# Patient Record
Sex: Female | Born: 1945 | Race: White | Hispanic: No | Marital: Married | Smoking: Never smoker
Health system: Southern US, Community
[De-identification: ages and names within clinical notes are randomized; demographics above are authoritative.]

## PROBLEM LIST (undated history)

## (undated) DIAGNOSIS — M810 Age-related osteoporosis without current pathological fracture: Secondary | ICD-10-CM

## (undated) DIAGNOSIS — F32A Depression, unspecified: Secondary | ICD-10-CM

## (undated) DIAGNOSIS — G0490 Encephalitis and encephalomyelitis, unspecified: Secondary | ICD-10-CM

## (undated) DIAGNOSIS — R569 Unspecified convulsions: Secondary | ICD-10-CM

## (undated) DIAGNOSIS — F329 Major depressive disorder, single episode, unspecified: Secondary | ICD-10-CM

## (undated) HISTORY — PX: SPLENECTOMY, TOTAL: SHX788

## (undated) HISTORY — DX: Encephalitis and encephalomyelitis, unspecified: G04.90

## (undated) HISTORY — PX: ABDOMINAL HYSTERECTOMY: SHX81

## (undated) HISTORY — PX: OTHER SURGICAL HISTORY: SHX169

## (undated) HISTORY — DX: Unspecified convulsions: R56.9

---

## 2016-10-09 DIAGNOSIS — M25562 Pain in left knee: Secondary | ICD-10-CM | POA: Diagnosis not present

## 2016-10-09 DIAGNOSIS — M25561 Pain in right knee: Secondary | ICD-10-CM | POA: Diagnosis not present

## 2016-10-09 DIAGNOSIS — M25571 Pain in right ankle and joints of right foot: Secondary | ICD-10-CM | POA: Diagnosis not present

## 2016-10-09 DIAGNOSIS — R262 Difficulty in walking, not elsewhere classified: Secondary | ICD-10-CM | POA: Diagnosis not present

## 2016-10-12 DIAGNOSIS — M25571 Pain in right ankle and joints of right foot: Secondary | ICD-10-CM | POA: Diagnosis not present

## 2016-10-12 DIAGNOSIS — M25561 Pain in right knee: Secondary | ICD-10-CM | POA: Diagnosis not present

## 2016-10-12 DIAGNOSIS — M25562 Pain in left knee: Secondary | ICD-10-CM | POA: Diagnosis not present

## 2016-10-12 DIAGNOSIS — R262 Difficulty in walking, not elsewhere classified: Secondary | ICD-10-CM | POA: Diagnosis not present

## 2016-10-16 DIAGNOSIS — R262 Difficulty in walking, not elsewhere classified: Secondary | ICD-10-CM | POA: Diagnosis not present

## 2016-10-16 DIAGNOSIS — M25561 Pain in right knee: Secondary | ICD-10-CM | POA: Diagnosis not present

## 2016-10-16 DIAGNOSIS — M25571 Pain in right ankle and joints of right foot: Secondary | ICD-10-CM | POA: Diagnosis not present

## 2016-10-16 DIAGNOSIS — Z1231 Encounter for screening mammogram for malignant neoplasm of breast: Secondary | ICD-10-CM | POA: Diagnosis not present

## 2016-10-16 DIAGNOSIS — M25562 Pain in left knee: Secondary | ICD-10-CM | POA: Diagnosis not present

## 2016-10-18 DIAGNOSIS — M25571 Pain in right ankle and joints of right foot: Secondary | ICD-10-CM | POA: Diagnosis not present

## 2016-10-18 DIAGNOSIS — M25561 Pain in right knee: Secondary | ICD-10-CM | POA: Diagnosis not present

## 2016-10-18 DIAGNOSIS — M25562 Pain in left knee: Secondary | ICD-10-CM | POA: Diagnosis not present

## 2016-10-18 DIAGNOSIS — R262 Difficulty in walking, not elsewhere classified: Secondary | ICD-10-CM | POA: Diagnosis not present

## 2016-10-22 DIAGNOSIS — M25562 Pain in left knee: Secondary | ICD-10-CM | POA: Diagnosis not present

## 2016-10-22 DIAGNOSIS — R262 Difficulty in walking, not elsewhere classified: Secondary | ICD-10-CM | POA: Diagnosis not present

## 2016-10-22 DIAGNOSIS — M25571 Pain in right ankle and joints of right foot: Secondary | ICD-10-CM | POA: Diagnosis not present

## 2016-10-22 DIAGNOSIS — M25561 Pain in right knee: Secondary | ICD-10-CM | POA: Diagnosis not present

## 2016-10-25 DIAGNOSIS — M25562 Pain in left knee: Secondary | ICD-10-CM | POA: Diagnosis not present

## 2016-10-25 DIAGNOSIS — R262 Difficulty in walking, not elsewhere classified: Secondary | ICD-10-CM | POA: Diagnosis not present

## 2016-10-25 DIAGNOSIS — M25571 Pain in right ankle and joints of right foot: Secondary | ICD-10-CM | POA: Diagnosis not present

## 2016-10-25 DIAGNOSIS — M25561 Pain in right knee: Secondary | ICD-10-CM | POA: Diagnosis not present

## 2016-10-29 DIAGNOSIS — M25561 Pain in right knee: Secondary | ICD-10-CM | POA: Diagnosis not present

## 2016-10-29 DIAGNOSIS — R262 Difficulty in walking, not elsewhere classified: Secondary | ICD-10-CM | POA: Diagnosis not present

## 2016-10-29 DIAGNOSIS — M25571 Pain in right ankle and joints of right foot: Secondary | ICD-10-CM | POA: Diagnosis not present

## 2016-10-29 DIAGNOSIS — M25562 Pain in left knee: Secondary | ICD-10-CM | POA: Diagnosis not present

## 2016-11-02 DIAGNOSIS — M25571 Pain in right ankle and joints of right foot: Secondary | ICD-10-CM | POA: Diagnosis not present

## 2016-11-02 DIAGNOSIS — M25562 Pain in left knee: Secondary | ICD-10-CM | POA: Diagnosis not present

## 2016-11-02 DIAGNOSIS — R262 Difficulty in walking, not elsewhere classified: Secondary | ICD-10-CM | POA: Diagnosis not present

## 2016-11-02 DIAGNOSIS — M25561 Pain in right knee: Secondary | ICD-10-CM | POA: Diagnosis not present

## 2016-11-04 DIAGNOSIS — R079 Chest pain, unspecified: Secondary | ICD-10-CM | POA: Diagnosis not present

## 2016-11-04 DIAGNOSIS — R630 Anorexia: Secondary | ICD-10-CM | POA: Diagnosis not present

## 2016-11-04 DIAGNOSIS — R52 Pain, unspecified: Secondary | ICD-10-CM | POA: Diagnosis not present

## 2016-11-04 DIAGNOSIS — N390 Urinary tract infection, site not specified: Secondary | ICD-10-CM | POA: Diagnosis not present

## 2016-11-04 DIAGNOSIS — H838X9 Other specified diseases of inner ear, unspecified ear: Secondary | ICD-10-CM | POA: Diagnosis not present

## 2016-11-04 DIAGNOSIS — R0689 Other abnormalities of breathing: Secondary | ICD-10-CM | POA: Diagnosis not present

## 2016-11-04 DIAGNOSIS — R0981 Nasal congestion: Secondary | ICD-10-CM | POA: Diagnosis not present

## 2016-11-04 DIAGNOSIS — R918 Other nonspecific abnormal finding of lung field: Secondary | ICD-10-CM | POA: Diagnosis not present

## 2016-11-06 DIAGNOSIS — M25562 Pain in left knee: Secondary | ICD-10-CM | POA: Diagnosis not present

## 2016-11-06 DIAGNOSIS — M25571 Pain in right ankle and joints of right foot: Secondary | ICD-10-CM | POA: Diagnosis not present

## 2016-11-06 DIAGNOSIS — R262 Difficulty in walking, not elsewhere classified: Secondary | ICD-10-CM | POA: Diagnosis not present

## 2016-11-06 DIAGNOSIS — M25561 Pain in right knee: Secondary | ICD-10-CM | POA: Diagnosis not present

## 2016-11-09 DIAGNOSIS — R262 Difficulty in walking, not elsewhere classified: Secondary | ICD-10-CM | POA: Diagnosis not present

## 2016-11-09 DIAGNOSIS — M25562 Pain in left knee: Secondary | ICD-10-CM | POA: Diagnosis not present

## 2016-11-09 DIAGNOSIS — M25561 Pain in right knee: Secondary | ICD-10-CM | POA: Diagnosis not present

## 2016-11-09 DIAGNOSIS — M25571 Pain in right ankle and joints of right foot: Secondary | ICD-10-CM | POA: Diagnosis not present

## 2016-11-13 DIAGNOSIS — M25571 Pain in right ankle and joints of right foot: Secondary | ICD-10-CM | POA: Diagnosis not present

## 2016-11-13 DIAGNOSIS — R262 Difficulty in walking, not elsewhere classified: Secondary | ICD-10-CM | POA: Diagnosis not present

## 2016-11-13 DIAGNOSIS — M25562 Pain in left knee: Secondary | ICD-10-CM | POA: Diagnosis not present

## 2016-11-13 DIAGNOSIS — R911 Solitary pulmonary nodule: Secondary | ICD-10-CM | POA: Diagnosis not present

## 2016-11-13 DIAGNOSIS — R918 Other nonspecific abnormal finding of lung field: Secondary | ICD-10-CM | POA: Diagnosis not present

## 2016-11-13 DIAGNOSIS — M25561 Pain in right knee: Secondary | ICD-10-CM | POA: Diagnosis not present

## 2016-11-14 DIAGNOSIS — Z8673 Personal history of transient ischemic attack (TIA), and cerebral infarction without residual deficits: Secondary | ICD-10-CM | POA: Diagnosis not present

## 2016-11-14 DIAGNOSIS — R918 Other nonspecific abnormal finding of lung field: Secondary | ICD-10-CM | POA: Diagnosis not present

## 2016-11-14 DIAGNOSIS — R591 Generalized enlarged lymph nodes: Secondary | ICD-10-CM | POA: Diagnosis not present

## 2016-11-14 DIAGNOSIS — R7989 Other specified abnormal findings of blood chemistry: Secondary | ICD-10-CM | POA: Diagnosis not present

## 2016-11-15 DIAGNOSIS — M25571 Pain in right ankle and joints of right foot: Secondary | ICD-10-CM | POA: Diagnosis not present

## 2016-11-15 DIAGNOSIS — M25561 Pain in right knee: Secondary | ICD-10-CM | POA: Diagnosis not present

## 2016-11-15 DIAGNOSIS — R262 Difficulty in walking, not elsewhere classified: Secondary | ICD-10-CM | POA: Diagnosis not present

## 2016-11-15 DIAGNOSIS — M25562 Pain in left knee: Secondary | ICD-10-CM | POA: Diagnosis not present

## 2016-11-16 DIAGNOSIS — R911 Solitary pulmonary nodule: Secondary | ICD-10-CM | POA: Diagnosis not present

## 2016-11-16 DIAGNOSIS — R918 Other nonspecific abnormal finding of lung field: Secondary | ICD-10-CM | POA: Diagnosis not present

## 2016-11-16 DIAGNOSIS — Z9889 Other specified postprocedural states: Secondary | ICD-10-CM | POA: Diagnosis not present

## 2016-11-20 DIAGNOSIS — R791 Abnormal coagulation profile: Secondary | ICD-10-CM | POA: Diagnosis not present

## 2016-11-20 DIAGNOSIS — Z79899 Other long term (current) drug therapy: Secondary | ICD-10-CM | POA: Diagnosis not present

## 2016-11-20 DIAGNOSIS — R918 Other nonspecific abnormal finding of lung field: Secondary | ICD-10-CM | POA: Diagnosis not present

## 2016-11-22 DIAGNOSIS — M25561 Pain in right knee: Secondary | ICD-10-CM | POA: Diagnosis not present

## 2016-11-22 DIAGNOSIS — M25571 Pain in right ankle and joints of right foot: Secondary | ICD-10-CM | POA: Diagnosis not present

## 2016-11-22 DIAGNOSIS — M25562 Pain in left knee: Secondary | ICD-10-CM | POA: Diagnosis not present

## 2016-11-22 DIAGNOSIS — R262 Difficulty in walking, not elsewhere classified: Secondary | ICD-10-CM | POA: Diagnosis not present

## 2016-11-26 DIAGNOSIS — J90 Pleural effusion, not elsewhere classified: Secondary | ICD-10-CM | POA: Diagnosis not present

## 2016-11-26 DIAGNOSIS — R911 Solitary pulmonary nodule: Secondary | ICD-10-CM | POA: Diagnosis not present

## 2016-11-26 DIAGNOSIS — J9811 Atelectasis: Secondary | ICD-10-CM | POA: Diagnosis not present

## 2016-11-26 DIAGNOSIS — D3A090 Benign carcinoid tumor of the bronchus and lung: Secondary | ICD-10-CM | POA: Diagnosis not present

## 2016-11-26 DIAGNOSIS — M19019 Primary osteoarthritis, unspecified shoulder: Secondary | ICD-10-CM | POA: Diagnosis not present

## 2016-11-26 DIAGNOSIS — J948 Other specified pleural conditions: Secondary | ICD-10-CM | POA: Diagnosis not present

## 2016-11-26 DIAGNOSIS — J939 Pneumothorax, unspecified: Secondary | ICD-10-CM | POA: Diagnosis not present

## 2016-11-26 DIAGNOSIS — R0989 Other specified symptoms and signs involving the circulatory and respiratory systems: Secondary | ICD-10-CM | POA: Diagnosis not present

## 2016-11-26 DIAGNOSIS — M4714 Other spondylosis with myelopathy, thoracic region: Secondary | ICD-10-CM | POA: Diagnosis not present

## 2016-11-26 DIAGNOSIS — C7A090 Malignant carcinoid tumor of the bronchus and lung: Secondary | ICD-10-CM | POA: Diagnosis present

## 2016-11-26 DIAGNOSIS — G8918 Other acute postprocedural pain: Secondary | ICD-10-CM | POA: Diagnosis not present

## 2016-11-26 DIAGNOSIS — Z87891 Personal history of nicotine dependence: Secondary | ICD-10-CM | POA: Diagnosis not present

## 2016-11-26 DIAGNOSIS — G40909 Epilepsy, unspecified, not intractable, without status epilepticus: Secondary | ICD-10-CM | POA: Diagnosis present

## 2016-11-26 DIAGNOSIS — Z9889 Other specified postprocedural states: Secondary | ICD-10-CM | POA: Diagnosis not present

## 2016-12-02 DIAGNOSIS — J90 Pleural effusion, not elsewhere classified: Secondary | ICD-10-CM | POA: Diagnosis not present

## 2016-12-02 DIAGNOSIS — D72829 Elevated white blood cell count, unspecified: Secondary | ICD-10-CM | POA: Diagnosis not present

## 2016-12-02 DIAGNOSIS — J9811 Atelectasis: Secondary | ICD-10-CM | POA: Diagnosis not present

## 2016-12-02 DIAGNOSIS — R0789 Other chest pain: Secondary | ICD-10-CM | POA: Diagnosis not present

## 2016-12-02 DIAGNOSIS — Z87891 Personal history of nicotine dependence: Secondary | ICD-10-CM | POA: Diagnosis not present

## 2016-12-02 DIAGNOSIS — Z9889 Other specified postprocedural states: Secondary | ICD-10-CM | POA: Diagnosis not present

## 2016-12-02 DIAGNOSIS — G8918 Other acute postprocedural pain: Secondary | ICD-10-CM | POA: Diagnosis not present

## 2016-12-02 DIAGNOSIS — R079 Chest pain, unspecified: Secondary | ICD-10-CM | POA: Diagnosis not present

## 2016-12-11 DIAGNOSIS — M25562 Pain in left knee: Secondary | ICD-10-CM | POA: Diagnosis not present

## 2016-12-11 DIAGNOSIS — M25571 Pain in right ankle and joints of right foot: Secondary | ICD-10-CM | POA: Diagnosis not present

## 2016-12-11 DIAGNOSIS — M25561 Pain in right knee: Secondary | ICD-10-CM | POA: Diagnosis not present

## 2016-12-11 DIAGNOSIS — R262 Difficulty in walking, not elsewhere classified: Secondary | ICD-10-CM | POA: Diagnosis not present

## 2016-12-17 DIAGNOSIS — Z9889 Other specified postprocedural states: Secondary | ICD-10-CM | POA: Diagnosis not present

## 2016-12-17 DIAGNOSIS — J90 Pleural effusion, not elsewhere classified: Secondary | ICD-10-CM | POA: Diagnosis not present

## 2016-12-18 DIAGNOSIS — C3412 Malignant neoplasm of upper lobe, left bronchus or lung: Secondary | ICD-10-CM | POA: Diagnosis not present

## 2017-01-15 DIAGNOSIS — G40909 Epilepsy, unspecified, not intractable, without status epilepticus: Secondary | ICD-10-CM | POA: Diagnosis not present

## 2017-03-11 DIAGNOSIS — Z6837 Body mass index (BMI) 37.0-37.9, adult: Secondary | ICD-10-CM | POA: Diagnosis not present

## 2017-03-11 DIAGNOSIS — R0609 Other forms of dyspnea: Secondary | ICD-10-CM | POA: Diagnosis not present

## 2017-03-11 DIAGNOSIS — I358 Other nonrheumatic aortic valve disorders: Secondary | ICD-10-CM | POA: Diagnosis not present

## 2017-03-11 DIAGNOSIS — R600 Localized edema: Secondary | ICD-10-CM | POA: Diagnosis not present

## 2017-03-11 DIAGNOSIS — M25561 Pain in right knee: Secondary | ICD-10-CM | POA: Diagnosis not present

## 2017-03-18 DIAGNOSIS — R609 Edema, unspecified: Secondary | ICD-10-CM | POA: Diagnosis not present

## 2017-03-18 DIAGNOSIS — I358 Other nonrheumatic aortic valve disorders: Secondary | ICD-10-CM | POA: Diagnosis not present

## 2017-03-18 DIAGNOSIS — R600 Localized edema: Secondary | ICD-10-CM | POA: Diagnosis not present

## 2017-03-18 DIAGNOSIS — I517 Cardiomegaly: Secondary | ICD-10-CM | POA: Diagnosis not present

## 2017-03-18 DIAGNOSIS — I071 Rheumatic tricuspid insufficiency: Secondary | ICD-10-CM | POA: Diagnosis not present

## 2017-04-01 DIAGNOSIS — M1711 Unilateral primary osteoarthritis, right knee: Secondary | ICD-10-CM | POA: Diagnosis not present

## 2017-04-01 DIAGNOSIS — Z8781 Personal history of (healed) traumatic fracture: Secondary | ICD-10-CM | POA: Diagnosis not present

## 2017-04-01 DIAGNOSIS — M19071 Primary osteoarthritis, right ankle and foot: Secondary | ICD-10-CM | POA: Diagnosis not present

## 2017-04-17 DIAGNOSIS — M25571 Pain in right ankle and joints of right foot: Secondary | ICD-10-CM | POA: Diagnosis not present

## 2017-04-17 DIAGNOSIS — M19071 Primary osteoarthritis, right ankle and foot: Secondary | ICD-10-CM | POA: Diagnosis not present

## 2017-07-09 DIAGNOSIS — Z23 Encounter for immunization: Secondary | ICD-10-CM | POA: Diagnosis not present

## 2017-07-16 DIAGNOSIS — G40909 Epilepsy, unspecified, not intractable, without status epilepticus: Secondary | ICD-10-CM | POA: Diagnosis not present

## 2017-07-16 DIAGNOSIS — G0490 Encephalitis and encephalomyelitis, unspecified: Secondary | ICD-10-CM | POA: Diagnosis not present

## 2017-12-19 DIAGNOSIS — F329 Major depressive disorder, single episode, unspecified: Secondary | ICD-10-CM | POA: Diagnosis not present

## 2017-12-19 DIAGNOSIS — Z8249 Family history of ischemic heart disease and other diseases of the circulatory system: Secondary | ICD-10-CM | POA: Diagnosis not present

## 2017-12-19 DIAGNOSIS — M859 Disorder of bone density and structure, unspecified: Secondary | ICD-10-CM | POA: Diagnosis not present

## 2017-12-20 DIAGNOSIS — Z6837 Body mass index (BMI) 37.0-37.9, adult: Secondary | ICD-10-CM | POA: Diagnosis not present

## 2017-12-20 DIAGNOSIS — Z780 Asymptomatic menopausal state: Secondary | ICD-10-CM | POA: Diagnosis not present

## 2017-12-20 DIAGNOSIS — G40209 Localization-related (focal) (partial) symptomatic epilepsy and epileptic syndromes with complex partial seizures, not intractable, without status epilepticus: Secondary | ICD-10-CM | POA: Diagnosis not present

## 2017-12-20 DIAGNOSIS — Z0000 Encounter for general adult medical examination without abnormal findings: Secondary | ICD-10-CM | POA: Diagnosis not present

## 2017-12-20 DIAGNOSIS — I358 Other nonrheumatic aortic valve disorders: Secondary | ICD-10-CM | POA: Diagnosis not present

## 2017-12-23 DIAGNOSIS — H903 Sensorineural hearing loss, bilateral: Secondary | ICD-10-CM | POA: Diagnosis not present

## 2017-12-23 DIAGNOSIS — H6123 Impacted cerumen, bilateral: Secondary | ICD-10-CM | POA: Diagnosis not present

## 2017-12-23 DIAGNOSIS — H900 Conductive hearing loss, bilateral: Secondary | ICD-10-CM | POA: Diagnosis not present

## 2017-12-30 DIAGNOSIS — Z85118 Personal history of other malignant neoplasm of bronchus and lung: Secondary | ICD-10-CM | POA: Diagnosis not present

## 2017-12-31 DIAGNOSIS — Z780 Asymptomatic menopausal state: Secondary | ICD-10-CM | POA: Diagnosis not present

## 2017-12-31 DIAGNOSIS — M85852 Other specified disorders of bone density and structure, left thigh: Secondary | ICD-10-CM | POA: Diagnosis not present

## 2017-12-31 DIAGNOSIS — M810 Age-related osteoporosis without current pathological fracture: Secondary | ICD-10-CM | POA: Diagnosis not present

## 2017-12-31 DIAGNOSIS — I358 Other nonrheumatic aortic valve disorders: Secondary | ICD-10-CM | POA: Diagnosis not present

## 2017-12-31 DIAGNOSIS — Z1231 Encounter for screening mammogram for malignant neoplasm of breast: Secondary | ICD-10-CM | POA: Diagnosis not present

## 2018-01-14 DIAGNOSIS — D3A00 Benign carcinoid tumor of unspecified site: Secondary | ICD-10-CM | POA: Diagnosis not present

## 2018-02-05 DIAGNOSIS — M810 Age-related osteoporosis without current pathological fracture: Secondary | ICD-10-CM | POA: Diagnosis not present

## 2018-02-05 DIAGNOSIS — Z6836 Body mass index (BMI) 36.0-36.9, adult: Secondary | ICD-10-CM | POA: Diagnosis not present

## 2018-02-11 DIAGNOSIS — G40909 Epilepsy, unspecified, not intractable, without status epilepticus: Secondary | ICD-10-CM | POA: Diagnosis not present

## 2018-03-28 ENCOUNTER — Encounter: Payer: Self-pay | Admitting: Neurology

## 2018-05-23 DIAGNOSIS — M2041 Other hammer toe(s) (acquired), right foot: Secondary | ICD-10-CM | POA: Diagnosis not present

## 2018-05-23 DIAGNOSIS — M2042 Other hammer toe(s) (acquired), left foot: Secondary | ICD-10-CM | POA: Diagnosis not present

## 2018-05-23 DIAGNOSIS — L602 Onychogryphosis: Secondary | ICD-10-CM | POA: Diagnosis not present

## 2018-05-23 DIAGNOSIS — B351 Tinea unguium: Secondary | ICD-10-CM | POA: Diagnosis not present

## 2018-05-30 DIAGNOSIS — B351 Tinea unguium: Secondary | ICD-10-CM | POA: Diagnosis not present

## 2018-06-10 ENCOUNTER — Encounter: Payer: Self-pay | Admitting: Neurology

## 2018-06-10 ENCOUNTER — Ambulatory Visit (INDEPENDENT_AMBULATORY_CARE_PROVIDER_SITE_OTHER): Payer: Medicare Other | Admitting: Neurology

## 2018-06-10 ENCOUNTER — Other Ambulatory Visit: Payer: Self-pay

## 2018-06-10 VITALS — BP 116/68 | HR 80 | Ht 66.0 in | Wt 211.0 lb

## 2018-06-10 DIAGNOSIS — Z8661 Personal history of infections of the central nervous system: Secondary | ICD-10-CM | POA: Diagnosis not present

## 2018-06-10 DIAGNOSIS — G40219 Localization-related (focal) (partial) symptomatic epilepsy and epileptic syndromes with complex partial seizures, intractable, without status epilepticus: Secondary | ICD-10-CM

## 2018-06-10 MED ORDER — LACOSAMIDE 100 MG PO TABS: ORAL_TABLET | ORAL | 3 refills | Status: DC

## 2018-06-10 MED ORDER — LEVETIRACETAM ER 500 MG PO TB24: ORAL_TABLET | ORAL | 3 refills | Status: DC

## 2018-06-10 NOTE — Patient Instructions (Addendum)
Great meeting you! 1. Continue Keppra XR 500mg : take 4 tablets every night and Vimpat 100mg  twice a day 2. Follow-up in 6 months, call for any changes  Seizure Precautions: 1. If medication has been prescribed for you to prevent seizures, take it exactly as directed.  Do not stop taking the medicine without talking to your doctor first, even if you have not had a seizure in a long time.   2. Avoid activities in which a seizure would cause danger to yourself or to others.  Don't operate dangerous machinery, swim alone, or climb in high or dangerous places, such as on ladders, roofs, or girders.  Do not drive unless your doctor says you may.  3. If you have any warning that you may have a seizure, lay down in a safe place where you can't hurt yourself.    4.  No driving for 6 months from last seizure, as per [facility].   Please refer to the following link on the [facility] website for more information: [email or web address]   5.  Maintain good sleep hygiene. Avoid alcohol.  6.  Contact your doctor if you have any problems that may be related to the medicine you are taking.  7.  Call 911 and bring the patient back to the ED if:        A.  The seizure lasts longer than 5 minutes.       B.  The patient doesn't awaken shortly after the seizure  C.  The patient has new problems such as difficulty seeing, speaking or moving  D.  The patient was injured during the seizure  E.  The patient has a temperature over 102 F (39C)  F.  The patient vomited and now is having trouble breathing

## 2018-06-10 NOTE — Progress Notes (Signed)
NEUROLOGY CONSULTATION NOTE  Mallory Boone MRN: [id number] DOB: 06/27/1946  Referring provider: Dr. Jalene Mullet Primary care provider: none listed  Reason for consult:  Establish care for seizures  Dear Dr Ysidro Evert:  Thank you for your kind referral of Mallory Boone for consultation of the above symptoms. Although her history is well known to you, please allow me to reiterate it for the purpose of our medical record. The patient was accompanied to the clinic by her husband who also provides collateral information. Records and images were personally reviewed where available.  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 She is amnestic of her seizures with no prior warning symptoms. Her husband describes stereotyped episodes where she would start rocking and grimacing, perseverating repeatedly saying "I'm okay, it's okay" for 10 seconds. She would be disoriented after, saying "I'm hot, I'm cold, I need to use the bathroom, where is it?" Her husband feels they occur more when she is slowing down or when it is quiet (such as in church). He has not seen any nocturnal seizures. She has only had 2 convulsions when she was initially diagnosed with encephalitis in 1996, none since then. He recalls her trying Dilantin and Lamictal in the past. She has been on Keppra XR 2000mg  daily for at least 10 years, Vimpat 100mg  BID was added on 18 months ago. She was having 2-3 seizures a day, and had a significant reduction with addition of Vimpat. Over the past 6 months, her husband has noticed she would sometimes do a little pedaling of both feet. Her husband reports an average of 2 seizures a week, last seizure was 2 days ago. No associated tongue bite or incontinence. No side effects on medications. She denies any olfactory/gustatory hallucinations, deja vu, rising  epigastric sensation, focal numbness/tingling/weakness, myoclonic jerks. She denies any headaches, dizziness, diplopia, dysarthria/dysphagia, neck/back pain, bowel dysfunction. She has urinary frequency. Her hands feels stiff sometimes. She has had cognitive issues since the encephalitis, she does not drive. Her husband manages finances. She has to write things down in a journal daily. She manages her own medications. She is independent with dressing and bathing. They moved to [place] 3 months ago to be closer to family. She said they moved a year ago. Her mother had Alzheimer's disease, her father had Lewy Body dementia.  No prior EEGs or MRI available for review. 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 Per notes, she had an abnormal EEG in 2016, report unavailable for review.  Epilepsy Risk Factors:  Encephalitis in 1996. Otherwise she had a normal birth and early development.  There is no history of febrile convulsions, significant traumatic brain injury, neurosurgical procedures, or family history of seizures.  Prior AEDs: Dilantin, lamictal  PAST MEDICAL HISTORY: Past Medical History:  Diagnosis Date  . Encephalitis   . Seizures ([place])     PAST SURGICAL HISTORY: History reviewed. No pertinent surgical history.  MEDICATIONS:  Outpatient Encounter Medications as of 06/10/2018  Medication Sig  . calcium-vitamin D (OSCAL WITH D) 500-200 MG-UNIT tablet Take 1 tablet by mouth.  . Lacosamide 100 MG TABS Take 1 tablet twice a day  . levETIRAcetam (KEPPRA XR) 500 MG 24 hr tablet Take 4 tablets every night  . ranitidine (ZANTAC) 150 MG tablet Take 150 mg by mouth 2 (two) times daily.  . Vilazodone HCl (  VIIBRYD PO) Take by mouth.  .    .     No facility-administered encounter medications on file as of 06/10/2018.     ALLERGIES: No Known Allergies  FAMILY HISTORY: History reviewed. No pertinent family history.  SOCIAL HISTORY: Social History     Socioeconomic History  . Marital status: Married    Spouse name: Not on file  . Number of children: Not on file  . Years of education: Not on file  . Highest education level: Not on file  Occupational History  . Not on file  Social Needs  . Financial resource strain: Not on file  . Food insecurity:    Worry: Not on file    Inability: Not on file  . Transportation needs:    Medical: Not on file    Non-medical: Not on file  Tobacco Use  . Smoking status: Not on file  Substance and Sexual Activity  . Alcohol use: Not on file  . Drug use: Not on file  . Sexual activity: Not on file  Lifestyle  . Physical activity:    Days per week: Not on file    Minutes per session: Not on file  . Stress: Not on file  Relationships  . Social connections:    Talks on phone: Not on file    Gets together: Not on file    Attends religious service: Not on file    Active member of club or organization: Not on file    Attends meetings of clubs or organizations: Not on file    Relationship status: Not on file  . Intimate partner violence:    Fear of current or ex partner: Not on file    Emotionally abused: Not on file    Physically abused: Not on file    Forced sexual activity: Not on file  Other Topics Concern  . Not on file  Social History Narrative  . Not on file    REVIEW OF SYSTEMS: Constitutional: No fevers, chills, or sweats, no generalized fatigue, change in appetite Eyes: No visual changes, double vision, eye pain Ear, nose and throat: No hearing loss, ear pain, nasal congestion, sore throat Cardiovascular: No chest pain, palpitations Respiratory:  No shortness of breath at rest or with exertion, wheezes GastrointestinaI: No nausea, vomiting, diarrhea, abdominal pain, fecal incontinence Genitourinary:  No dysuria, urinary retention or frequency Musculoskeletal:  No neck pain, back pain Integumentary: No rash, pruritus, skin lesions Neurological: as above Psychiatric: No  depression, insomnia, anxiety Endocrine: No palpitations, fatigue, diaphoresis, mood swings, change in appetite, change in weight, increased thirst Hematologic/Lymphatic:  No anemia, purpura, petechiae. Allergic/Immunologic: no itchy/runny eyes, nasal congestion, recent allergic reactions, rashes  PHYSICAL EXAM: Vitals:   06/10/18 1430  BP: 116/68  Pulse: 80  SpO2: 95%   General: No acute distress Head:  Normocephalic/atraumatic Eyes: Fundoscopic exam shows bilateral sharp discs, no vessel changes, exudates, or hemorrhages Neck: supple, no paraspinal tenderness, full range of motion Back: No paraspinal tenderness Heart: regular rate and rhythm Lungs: Clear to auscultation bilaterally. Vascular: No carotid bruits. Skin/Extremities: No rash, no edema Neurological Exam: Mental status: alert and oriented to person, place, day of week. States it is June/spring. No dysarthria. She has mild expressive aphasia with word-finding difficulties, difficulty naming. Fund of knowledge is reduced.  Recent and remote memory are impaired.  Attention and concentration are normal.    Able to repeat phrases. Difficulty naming "button." When asked similarity between train and bike,she says "legs." MMSE - Mini  Mental State Exam 06/10/2018  Orientation to time 1  Orientation to Place 3  Registration 3  Attention/ Calculation 5  Recall 0  Language- name 2 objects 1  Language- repeat 1  Language- follow 3 step command 3  Language- read & follow direction 1  Write a sentence 1  Copy design 1  Total score 20   Cranial nerves: CN I: not tested CN II: pupils equal, round and reactive to light, visual fields intact, fundi unremarkable. CN III, IV, VI:  full range of motion, no nystagmus, no ptosis CN V: facial sensation intact CN VII: upper and lower face symmetric CN VIII: hearing intact to finger rub CN IX, X: gag intact, uvula midline CN XI: sternocleidomastoid and trapezius muscles intact CN XII:  tongue midline Bulk & Tone: normal, no fasciculations. Motor: 5/5 throughout with no pronator drift. Sensation: intact to light touch, cold, pin, vibration and joint position sense.  No extinction to double simultaneous stimulation.  Romberg test negative Deep Tendon Reflexes: +2 throughout, no ankle clonus Plantar responses: downgoing bilaterally Cerebellar: no incoordination on finger to nose, heel to shin. 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 No prior MRI or EEG available for review. She continues to have around 2 seizures a week, however she and her husband are satisfied with current seizure frequency since this is significantly less than prior. We discussed that there is room to increase Vimpat, they would like to hold off for now. Refills for Keppra XR 500mg  4 tabs qhs and Vimpat 100mg  BID were sent in today. She does not drive. Follow-up in 6 months, they know to call for any changes.   Thank you for allowing me to participate in the care of this patient. Please do not hesitate to call for any questions or concerns.   Ellouise Newer, M.D.  CC: Dr. Ysidro Evert

## 2018-06-23 DIAGNOSIS — Z23 Encounter for immunization: Secondary | ICD-10-CM | POA: Diagnosis not present

## 2018-06-24 DIAGNOSIS — H25013 Cortical age-related cataract, bilateral: Secondary | ICD-10-CM | POA: Diagnosis not present

## 2018-06-24 DIAGNOSIS — H02831 Dermatochalasis of right upper eyelid: Secondary | ICD-10-CM | POA: Diagnosis not present

## 2018-06-24 DIAGNOSIS — H25043 Posterior subcapsular polar age-related cataract, bilateral: Secondary | ICD-10-CM | POA: Diagnosis not present

## 2018-06-24 DIAGNOSIS — H2513 Age-related nuclear cataract, bilateral: Secondary | ICD-10-CM | POA: Diagnosis not present

## 2018-06-24 DIAGNOSIS — H2512 Age-related nuclear cataract, left eye: Secondary | ICD-10-CM | POA: Diagnosis not present

## 2018-06-24 DIAGNOSIS — H18413 Arcus senilis, bilateral: Secondary | ICD-10-CM | POA: Diagnosis not present

## 2018-06-30 ENCOUNTER — Other Ambulatory Visit: Payer: Self-pay

## 2018-06-30 ENCOUNTER — Encounter (HOSPITAL_COMMUNITY): Payer: Self-pay | Admitting: *Deleted

## 2018-06-30 ENCOUNTER — Emergency Department (HOSPITAL_COMMUNITY): Payer: Medicare Other

## 2018-06-30 ENCOUNTER — Inpatient Hospital Stay (HOSPITAL_COMMUNITY)
Admission: EM | Admit: 2018-06-30 | Discharge: 2018-07-03 | DRG: 101 | Disposition: A | Discharge status: Home Health | Payer: Medicare Other | Attending: Family Medicine | Admitting: Family Medicine

## 2018-06-30 DIAGNOSIS — R509 Fever, unspecified: Secondary | ICD-10-CM | POA: Diagnosis present

## 2018-06-30 DIAGNOSIS — Z7983 Long term (current) use of bisphosphonates: Secondary | ICD-10-CM | POA: Diagnosis not present

## 2018-06-30 DIAGNOSIS — G8384 Todd's paralysis (postepileptic): Secondary | ICD-10-CM | POA: Diagnosis present

## 2018-06-30 DIAGNOSIS — Z8661 Personal history of infections of the central nervous system: Secondary | ICD-10-CM | POA: Diagnosis not present

## 2018-06-30 DIAGNOSIS — Z902 Acquired absence of lung [part of]: Secondary | ICD-10-CM

## 2018-06-30 DIAGNOSIS — D72829 Elevated white blood cell count, unspecified: Secondary | ICD-10-CM | POA: Diagnosis present

## 2018-06-30 DIAGNOSIS — R569 Unspecified convulsions: Secondary | ICD-10-CM

## 2018-06-30 DIAGNOSIS — I6523 Occlusion and stenosis of bilateral carotid arteries: Secondary | ICD-10-CM | POA: Diagnosis not present

## 2018-06-30 DIAGNOSIS — F0390 Unspecified dementia without behavioral disturbance: Secondary | ICD-10-CM | POA: Diagnosis present

## 2018-06-30 DIAGNOSIS — E876 Hypokalemia: Secondary | ICD-10-CM | POA: Diagnosis present

## 2018-06-30 DIAGNOSIS — G9389 Other specified disorders of brain: Secondary | ICD-10-CM | POA: Diagnosis present

## 2018-06-30 DIAGNOSIS — Z90710 Acquired absence of both cervix and uterus: Secondary | ICD-10-CM | POA: Diagnosis not present

## 2018-06-30 DIAGNOSIS — Z9081 Acquired absence of spleen: Secondary | ICD-10-CM | POA: Diagnosis not present

## 2018-06-30 DIAGNOSIS — Z6835 Body mass index (BMI) 35.0-35.9, adult: Secondary | ICD-10-CM

## 2018-06-30 DIAGNOSIS — Z86012 Personal history of benign carcinoid tumor: Secondary | ICD-10-CM

## 2018-06-30 DIAGNOSIS — G40909 Epilepsy, unspecified, not intractable, without status epilepticus: Secondary | ICD-10-CM | POA: Diagnosis not present

## 2018-06-30 DIAGNOSIS — F39 Unspecified mood [affective] disorder: Secondary | ICD-10-CM | POA: Diagnosis present

## 2018-06-30 DIAGNOSIS — G0490 Encephalitis and encephalomyelitis, unspecified: Secondary | ICD-10-CM | POA: Diagnosis not present

## 2018-06-30 DIAGNOSIS — R0789 Other chest pain: Secondary | ICD-10-CM | POA: Diagnosis not present

## 2018-06-30 DIAGNOSIS — R29707 NIHSS score 7: Secondary | ICD-10-CM | POA: Diagnosis present

## 2018-06-30 DIAGNOSIS — T368X5A Adverse effect of other systemic antibiotics, initial encounter: Secondary | ICD-10-CM | POA: Diagnosis not present

## 2018-06-30 DIAGNOSIS — R4182 Altered mental status, unspecified: Secondary | ICD-10-CM | POA: Diagnosis not present

## 2018-06-30 DIAGNOSIS — F329 Major depressive disorder, single episode, unspecified: Secondary | ICD-10-CM | POA: Diagnosis present

## 2018-06-30 DIAGNOSIS — Y92230 Patient room in hospital as the place of occurrence of the external cause: Secondary | ICD-10-CM | POA: Diagnosis not present

## 2018-06-30 DIAGNOSIS — G40409 Other generalized epilepsy and epileptic syndromes, not intractable, without status epilepticus: Secondary | ICD-10-CM | POA: Diagnosis present

## 2018-06-30 DIAGNOSIS — M810 Age-related osteoporosis without current pathological fracture: Secondary | ICD-10-CM | POA: Diagnosis present

## 2018-06-30 DIAGNOSIS — R21 Rash and other nonspecific skin eruption: Secondary | ICD-10-CM | POA: Diagnosis not present

## 2018-06-30 DIAGNOSIS — R531 Weakness: Secondary | ICD-10-CM | POA: Diagnosis not present

## 2018-06-30 DIAGNOSIS — G9340 Encephalopathy, unspecified: Secondary | ICD-10-CM | POA: Diagnosis present

## 2018-06-30 DIAGNOSIS — G939 Disorder of brain, unspecified: Secondary | ICD-10-CM | POA: Diagnosis present

## 2018-06-30 DIAGNOSIS — E663 Overweight: Secondary | ICD-10-CM | POA: Diagnosis present

## 2018-06-30 DIAGNOSIS — L271 Localized skin eruption due to drugs and medicaments taken internally: Secondary | ICD-10-CM | POA: Diagnosis not present

## 2018-06-30 DIAGNOSIS — Z79899 Other long term (current) drug therapy: Secondary | ICD-10-CM

## 2018-06-30 HISTORY — DX: Major depressive disorder, single episode, unspecified: F32.9

## 2018-06-30 HISTORY — DX: Age-related osteoporosis without current pathological fracture: M81.0

## 2018-06-30 HISTORY — DX: Depression, unspecified: F32.A

## 2018-06-30 LAB — URINALYSIS, ROUTINE W REFLEX MICROSCOPIC
BILIRUBIN URINE: NEGATIVE
GLUCOSE, UA: NEGATIVE mg/dL
Hgb urine dipstick: NEGATIVE
KETONES UR: NEGATIVE mg/dL
Leukocytes, UA: NEGATIVE
Nitrite: NEGATIVE
PH: 9 — AB (ref 5.0–8.0)
Protein, ur: NEGATIVE mg/dL
Specific Gravity, Urine: 1.03 (ref 1.005–1.030)

## 2018-06-30 LAB — DIFFERENTIAL
BASOS PCT: 1 %
Basophils Absolute: 0.1 10*3/uL (ref 0.0–0.1)
EOS PCT: 0 %
Eosinophils Absolute: 0 10*3/uL (ref 0.0–0.7)
LYMPHS PCT: 21 %
Lymphs Abs: 2.8 10*3/uL (ref 0.7–4.0)
Monocytes Absolute: 0.7 10*3/uL (ref 0.1–1.0)
Monocytes Relative: 5 %
NEUTROS PCT: 73 %
Neutro Abs: 9.5 10*3/uL — ABNORMAL HIGH (ref 1.7–7.7)

## 2018-06-30 LAB — PROTIME-INR
INR: 0.93
PROTHROMBIN TIME: 12.4 s (ref 11.4–15.2)

## 2018-06-30 LAB — COMPREHENSIVE METABOLIC PANEL
ALBUMIN: 3.5 g/dL (ref 3.5–5.0)
ALK PHOS: 67 U/L (ref 38–126)
ALT: 12 U/L (ref 0–44)
ANION GAP: 12 (ref 5–15)
AST: 33 U/L (ref 15–41)
BUN: 15 mg/dL (ref 8–23)
CO2: 23 mmol/L (ref 22–32)
Calcium: 9.2 mg/dL (ref 8.9–10.3)
Chloride: 103 mmol/L (ref 98–111)
Creatinine, Ser: 0.78 mg/dL (ref 0.44–1.00)
GFR calc Af Amer: >60 mL/min (ref >60)
GFR calc non Af Amer: >60 mL/min (ref >60)
GLUCOSE: 165 mg/dL — AB (ref 70–99)
Potassium: 4.9 mmol/L (ref 3.5–5.1)
SODIUM: 138 mmol/L (ref 135–145)
Total Bilirubin: 1.2 mg/dL (ref 0.3–1.2)
Total Protein: 7.2 g/dL (ref 6.5–8.1)

## 2018-06-30 LAB — I-STAT CHEM 8, ED
BUN: 18 mg/dL (ref 8–23)
CHLORIDE: 104 mmol/L (ref 98–111)
Calcium, Ion: 1.09 mmol/L — ABNORMAL LOW (ref 1.15–1.40)
Creatinine, Ser: 0.6 mg/dL (ref 0.44–1.00)
Glucose, Bld: 172 mg/dL — ABNORMAL HIGH (ref 70–99)
HEMATOCRIT: 37 % (ref 36.0–46.0)
Hemoglobin: 12.6 g/dL (ref 12.0–15.0)
Potassium: 4.9 mmol/L (ref 3.5–5.1)
SODIUM: 138 mmol/L (ref 135–145)
TCO2: 27 mmol/L (ref 22–32)

## 2018-06-30 LAB — CBC
HCT: 43 % (ref 36.0–46.0)
Hemoglobin: 14.1 g/dL (ref 12.0–15.0)
MCH: 30.1 pg (ref 26.0–34.0)
MCHC: 32.8 g/dL (ref 30.0–36.0)
MCV: 91.9 fL (ref 78.0–100.0)
PLATELETS: 406 10*3/uL — AB (ref 150–400)
RBC: 4.68 MIL/uL (ref 3.87–5.11)
RDW: 13.7 % (ref 11.5–15.5)
WBC: 13 10*3/uL — ABNORMAL HIGH (ref 4.0–10.5)

## 2018-06-30 LAB — ETHANOL: Alcohol, Ethyl (B): <10 mg/dL (ref <10)

## 2018-06-30 LAB — RAPID URINE DRUG SCREEN, HOSP PERFORMED
Amphetamines: NOT DETECTED
Barbiturates: NOT DETECTED
Benzodiazepines: NOT DETECTED
COCAINE: NOT DETECTED
OPIATES: NOT DETECTED
TETRAHYDROCANNABINOL: NOT DETECTED

## 2018-06-30 LAB — I-STAT TROPONIN, ED: Troponin i, poc: 0.01 ng/mL (ref 0.00–0.08)

## 2018-06-30 LAB — CBG MONITORING, ED: Glucose-Capillary: 151 mg/dL — ABNORMAL HIGH (ref 70–99)

## 2018-06-30 LAB — APTT: aPTT: 28 seconds (ref 24–36)

## 2018-06-30 MED ORDER — VANCOMYCIN HCL IN DEXTROSE 1-5 GM/200ML-% IV SOLN
1000.0000 mg | Freq: Once | INTRAVENOUS | Status: DC
  Filled 2018-06-30: qty 200

## 2018-06-30 MED ORDER — HALOPERIDOL LACTATE 5 MG/ML IJ SOLN
INTRAMUSCULAR | Status: AC
  Administered 2018-06-30: 2 mg via INTRAVENOUS
  Filled 2018-06-30: qty 1

## 2018-06-30 MED ORDER — VANCOMYCIN HCL 10 G IV SOLR
2000.0000 mg | Freq: Once | INTRAVENOUS | Status: AC
  Administered 2018-07-01: 2000 mg via INTRAVENOUS
  Filled 2018-06-30 (×3): qty 2000

## 2018-06-30 MED ORDER — SODIUM CHLORIDE 0.9 % IV SOLN
1000.0000 mg | Freq: Once | INTRAVENOUS | Status: AC
  Administered 2018-06-30: 1000 mg via INTRAVENOUS
  Filled 2018-06-30: qty 20

## 2018-06-30 MED ORDER — ALTEPLASE (STROKE) FULL DOSE INFUSION
0.9000 mg/kg | Freq: Once | INTRAVENOUS | Status: DC
  Filled 2018-06-30: qty 100

## 2018-06-30 MED ORDER — SODIUM CHLORIDE 0.9 % IV SOLN: 2.0000 g | Freq: Once | INTRAVENOUS | Status: DC

## 2018-06-30 MED ORDER — VANCOMYCIN HCL IN DEXTROSE 1-5 GM/200ML-% IV SOLN: 1000.0000 mg | Freq: Once | INTRAVENOUS | Status: DC

## 2018-06-30 MED ORDER — MIDAZOLAM HCL 2 MG/2ML IJ SOLN
4.0000 mg | INTRAMUSCULAR | Status: DC | PRN
  Administered 2018-06-30 (×5): 0.5 mg via INTRAVENOUS
  Administered 2018-07-01 – 2018-07-02 (×2): 4 mg via INTRAVENOUS
  Filled 2018-06-30 (×3): qty 4

## 2018-06-30 MED ORDER — ONDANSETRON HCL 4 MG/2ML IJ SOLN
4.0000 mg | Freq: Once | INTRAMUSCULAR | Status: AC
  Administered 2018-06-30: 4 mg via INTRAVENOUS
  Filled 2018-06-30: qty 2

## 2018-06-30 MED ORDER — SODIUM CHLORIDE 0.9 % IV SOLN
2.0000 g | Freq: Once | INTRAVENOUS | Status: AC
  Administered 2018-06-30: 2 g via INTRAVENOUS
  Filled 2018-06-30: qty 20

## 2018-06-30 MED ORDER — LORAZEPAM 2 MG/ML IJ SOLN
1.0000 mg | Freq: Once | INTRAMUSCULAR | Status: AC
  Administered 2018-06-30: 1 mg via INTRAVENOUS
  Filled 2018-06-30: qty 1

## 2018-06-30 MED ORDER — IOPAMIDOL (ISOVUE-370) INJECTION 76%
INTRAVENOUS | Status: AC
  Filled 2018-06-30: qty 100

## 2018-06-30 MED ORDER — DEXTROSE 5 % IV SOLN
10.0000 mg/kg | Freq: Once | INTRAVENOUS | Status: AC
  Administered 2018-07-01: 945 mg via INTRAVENOUS
  Filled 2018-06-30: qty 18.9

## 2018-06-30 MED ORDER — IOPAMIDOL (ISOVUE-370) INJECTION 76%
100.0000 mL | Freq: Once | INTRAVENOUS | Status: AC | PRN
  Administered 2018-06-30: 100 mL via INTRAVENOUS

## 2018-06-30 MED ORDER — DEXTROSE 5 % IV SOLN
10.0000 mg/kg | Freq: Once | INTRAVENOUS | Status: DC
  Filled 2018-06-30: qty 18.9

## 2018-06-30 MED ORDER — ACETAMINOPHEN 325 MG PO TABS
650.0000 mg | ORAL_TABLET | Freq: Once | ORAL | Status: AC
  Administered 2018-06-30: 650 mg via ORAL
  Filled 2018-06-30: qty 2

## 2018-06-30 MED ORDER — LORAZEPAM 2 MG/ML IJ SOLN
1.0000 mg | Freq: Once | INTRAMUSCULAR | Status: AC
  Administered 2018-06-30: 1 mg via INTRAVENOUS

## 2018-06-30 MED ORDER — SODIUM CHLORIDE 0.9 % IV BOLUS
500.0000 mL | Freq: Once | INTRAVENOUS | Status: AC
  Administered 2018-06-30: 500 mL via INTRAVENOUS

## 2018-06-30 MED ORDER — HALOPERIDOL LACTATE 5 MG/ML IJ SOLN
2.0000 mg | Freq: Once | INTRAMUSCULAR | Status: AC
  Administered 2018-06-30: 2 mg via INTRAVENOUS

## 2018-06-30 NOTE — ED Notes (Signed)
Pt is very agitated and has pulled out one of her IVs. She is stating that she needs to put makeup on and is messing with her sheets. Staff is trying to calm pt to no avail. MD notified.

## 2018-06-30 NOTE — H&P (Signed)
History and Physical   Mallory Boone IWP:[id number] DOB: 1946/07/20 DOA: 06/30/2018  Referring MD/NP/PA: Dr Sedonia Small  PCP: Patient, No Pcp Per   Outpatient Specialists: Ellouise Newer, MD [facility]   Patient coming from: Home  Chief Complaint: Seizure  HPI: Mallory Boone is a 72 y.o. 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  She has been on Keppra and Vimpat.  Patient has relatively stabilized until today when she had multiple episodes of tonic-clonic seizures.  This is different from her typical seizures.  Husband is here with the patient he brought her into the ER where she was found to have left-sided weakness and gaze preference to the right.  Stroke was suspected initially and patient was evaluated as a Code stroke.  She however did not turn out to have a stroke.  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  Patient then developed high fever with continued altered mental status.  Attempted lumbar puncture to ice has failed.  Neurology has been consulted and recommendation is to admit patient for work-up of possible CNS infection.  Patient is currently not able to give history so history is mainly from husband. ED Course: Patient's temperature is 101.7 with blood pressure 170/81 pulse 90 respiratory of 20 oxygen sats 90% room air.  Urinalysis so far negative.  Urine drug screen also negative.  CT head without contrast with CT angiogram of the neck and head were all negative.  CBC showed a white count of 13,000 and platelets 406 with left shift.  Glucose is 165 otherwise patient's blood work appears within normal.  She has been given a dose of acyclovir, vancomycin and Rocephin.  Neurology consulted and patient is being admitted to the hospital with neurology follow-up and possible repeat lumbar puncture attempt in the  morning  Review of Systems: As per HPI otherwise 10 point review of systems negative.    Past Medical History:  Diagnosis Date  . Depression   . Encephalitis   . Osteoporosis   . Seizures ([place])     Past Surgical History:  Procedure Laterality Date  . ABDOMINAL HYSTERECTOMY    . left lobe lobectomy    . SPLENECTOMY, TOTAL       reports that she has never smoked. She has never used smokeless tobacco. She reports that she does not drink alcohol or use drugs.  No Known Allergies  No family history on file.   Prior to Admission medications   Medication Sig Start Date End Date Taking? Authorizing Provider  alendronate (FOSAMAX) 70 MG tablet Take 70 mg by mouth once a week. Take with a full glass of water on an empty stomach.   Yes [provider]  calcium-vitamin D (OSCAL WITH D) 500-200 MG-UNIT tablet Take 1 tablet by mouth.   Yes [provider]  Lacosamide 100 MG TABS Take 1 tablet twice a day 06/10/18  Yes Cameron Sprang, MD  levETIRAcetam (KEPPRA XR) 500 MG 24 hr tablet Take 4 tablets every night 06/10/18  Yes Cameron Sprang, MD  Vilazodone HCl (VIIBRYD) 20 MG TABS Take 20 mg by mouth daily.    Yes [provider]    Physical Exam: Vitals:   06/30/18 2027 06/30/18 2145 06/30/18 2147 06/30/18 2200  BP:   117/82   Pulse:   90   Resp:   20   Temp:  Marland Kitchen)  101.7 F (38.7 C)  (!) 101.7 F (38.7 C)  TempSrc:  Oral    SpO2:   98%   Weight: 94.4 kg     Height:          Constitutional: NAD, calm, comfortable Vitals:   06/30/18 2027 06/30/18 2145 06/30/18 2147 06/30/18 2200  BP:   117/82   Pulse:   90   Resp:   20   Temp:  (!) 101.7 F (38.7 C)  (!) 101.7 F (38.7 C)  TempSrc:  Oral    SpO2:   98%   Weight: 94.4 kg     Height:       Patient is confused not communicating adequately. Eyes: PERRL, lids and conjunctivae normal ENMT: Mucous membranes are moist. Posterior pharynx clear of any exudate or lesions.Normal dentition.  Neck:  normal, supple, no masses, no thyromegaly Respiratory: clear to auscultation bilaterally, no wheezing, no crackles. Normal respiratory effort. No accessory muscle use.  Cardiovascular: Regular rate and rhythm, no murmurs / rubs / gallops. No extremity edema. 2+ pedal pulses. No carotid bruits.  Abdomen: no tenderness, no masses palpated. No hepatosplenomegaly. Bowel sounds positive.  Musculoskeletal: no clubbing / cyanosis. No joint deformity upper and lower extremities. Good ROM, no contractures. Normal muscle tone.  Skin: no rashes, lesions, ulcers. No induration Neurologic: Patient obtunded, confused, not communicating with altered mental status CN 2-12 grossly intact. Sensation intact, DTR normal. Strength 5/5 in all 4.  Psychiatric: Not assessed as patient is confused with altered mental status    Labs on Admission: I have personally reviewed following labs and imaging studies  CBC: Recent Labs  Lab 06/30/18 1958 06/30/18 2004  WBC 13.0*  --   NEUTROABS 9.5*  --   HGB 14.1 12.6  HCT 43.0 37.0  MCV 91.9  --   PLT 406*  --    Basic Metabolic Panel: Recent Labs  Lab 06/30/18 1958 06/30/18 2004  NA 138 138  K 4.9 4.9  CL 103 104  CO2 23  --   GLUCOSE 165* 172*  BUN 15 18  CREATININE 0.78 0.60  CALCIUM 9.2  --    GFR: Estimated Creatinine Clearance: 72.5 mL/min (by C-G formula based on SCr of 0.6 mg/dL). Liver Function Tests: Recent Labs  Lab 06/30/18 1958  AST 33  ALT 12  ALKPHOS 67  BILITOT 1.2  PROT 7.2  ALBUMIN 3.5   No results for input(s): LIPASE, AMYLASE in the last 168 hours. No results for input(s): AMMONIA in the last 168 hours. Coagulation Profile: Recent Labs  Lab 06/30/18 1958  INR 0.93   Cardiac Enzymes: No results for input(s): CKTOTAL, CKMB, CKMBINDEX, TROPONINI in the last 168 hours. BNP (last 3 results) No results for input(s): PROBNP in the last 8760 hours. HbA1C: No results for input(s): HGBA1C in the last 72 hours. CBG: Recent  Labs  Lab 06/30/18 2018  GLUCAP 151*   Lipid Profile: No results for input(s): CHOL, HDL, LDLCALC, TRIG, CHOLHDL, LDLDIRECT in the last 72 hours. Thyroid Function Tests: No results for input(s): TSH, T4TOTAL, FREET4, T3FREE, THYROIDAB in the last 72 hours. Anemia Panel: No results for input(s): VITAMINB12, FOLATE, FERRITIN, TIBC, IRON, RETICCTPCT in the last 72 hours. Urine analysis:    Component Value Date/Time   COLORURINE YELLOW 06/30/2018 2044   APPEARANCEUR CLEAR 06/30/2018 2044   LABSPEC 1.030 06/30/2018 2044   PHURINE 9.0 (H) 06/30/2018 2044   GLUCOSEU NEGATIVE 06/30/2018 2044   HGBUR NEGATIVE 06/30/2018 2044   BILIRUBINUR NEGATIVE  06/30/2018 2044   KETONESUR NEGATIVE 06/30/2018 2044   PROTEINUR NEGATIVE 06/30/2018 2044   NITRITE NEGATIVE 06/30/2018 2044   LEUKOCYTESUR NEGATIVE 06/30/2018 2044   Sepsis Labs: @LABRCNTIP (procalcitonin:4,lacticidven:4) )No results found for this or any previous visit (from the past 240 hour(s)).   Radiological Exams on Admission: Ct Angio Head W Or Wo Contrast  Result Date: 06/30/2018 CLINICAL DATA:  Seizure EXAM: CT ANGIOGRAPHY HEAD AND NECK TECHNIQUE: Multidetector CT imaging of the head and neck was performed using the standard protocol during bolus administration of intravenous contrast. Multiplanar CT image reconstructions and MIPs were obtained to evaluate the vascular anatomy. Carotid stenosis measurements (when applicable) are obtained utilizing NASCET criteria, using the distal internal carotid diameter as the denominator. CONTRAST:  115mL ISOVUE-370 IOPAMIDOL (ISOVUE-370) INJECTION 76% COMPARISON:  Head CT 06/30/2018 FINDINGS: CTA NECK FINDINGS AORTIC ARCH: There is no calcific atherosclerosis of the aortic arch. There is no aneurysm, dissection or hemodynamically significant stenosis of the visualized ascending aorta and aortic arch. Conventional 3 vessel aortic branching pattern. The visualized proximal subclavian arteries are widely  patent. RIGHT CAROTID SYSTEM: --Common carotid artery: Widely patent origin without common carotid artery dissection or aneurysm. --Internal carotid artery: No dissection, occlusion or aneurysm. Mild atherosclerotic calcification at the carotid bifurcation without hemodynamically significant stenosis. --External carotid artery: No acute abnormality. LEFT CAROTID SYSTEM: --Common carotid artery: Widely patent origin without common carotid artery dissection or aneurysm. --Internal carotid artery:No dissection, occlusion or aneurysm. Mild atherosclerotic calcification at the carotid bifurcation without hemodynamically significant stenosis. --External carotid artery: No acute abnormality. VERTEBRAL ARTERIES: Codominant configuration. Both origins are normal. No dissection, occlusion or flow-limiting stenosis to the vertebrobasilar confluence. SKELETON: There is no bony spinal canal stenosis. No lytic or blastic lesion. OTHER NECK: Normal pharynx, larynx and major salivary glands. No cervical lymphadenopathy. Unremarkable thyroid gland. UPPER CHEST: No pneumothorax or pleural effusion. No nodules or masses. CTA HEAD FINDINGS ANTERIOR CIRCULATION: --Intracranial internal carotid arteries: Normal. --Anterior cerebral arteries: Normal. Both A1 segments are present. Patent anterior communicating artery. --Middle cerebral arteries: Normal. --Posterior communicating arteries: Present bilaterally. POSTERIOR CIRCULATION: --Basilar artery: Normal. --Posterior cerebral arteries: Normal. --Superior cerebellar arteries: Normal. --Inferior cerebellar arteries: Normal anterior and posterior inferior cerebellar arteries. VENOUS SINUSES: As permitted by contrast timing, patent. ANATOMIC VARIANTS: None DELAYED PHASE: No parenchymal contrast enhancement. Left occipital lobe temporal encephalomalacia redemonstrated. Review of the MIP images confirms the above findings. IMPRESSION: 1. No emergent large vessel occlusion or hemodynamically  significant stenosis of the head or neck. 2. Mild bilateral carotid bifurcation calcific atherosclerosis without hemodynamically significant stenosis. Electronically Signed   By: Ulyses Jarred M.D.   On: 06/30/2018 20:40   Dg Chest 2 View  Result Date: 06/30/2018 CLINICAL DATA:  Seizure at 1630 hours. EXAM: CHEST - 2 VIEW COMPARISON:  None. FINDINGS: Mild pulmonary vascular congestion without alveolar consolidation, effusion or pneumothorax. Heart size is top-normal. There is moderate aortic atherosclerosis. Surgical clips project over the AP window. No acute fracture nor joint dislocations are apparent. IMPRESSION: Aortic atherosclerosis. Pulmonary vascular congestion consistent with mild CHF. Electronically Signed   By: Ashley Royalty M.D.   On: 06/30/2018 22:27   Ct Angio Neck W And/or Wo Contrast  Result Date: 06/30/2018 CLINICAL DATA:  Seizure EXAM: CT ANGIOGRAPHY HEAD AND NECK TECHNIQUE: Multidetector CT imaging of the head and neck was performed using the standard protocol during bolus administration of intravenous contrast. Multiplanar CT image reconstructions and MIPs were obtained to evaluate the vascular anatomy. Carotid stenosis measurements (when applicable) are obtained  utilizing NASCET criteria, using the distal internal carotid diameter as the denominator. CONTRAST:  11mL ISOVUE-370 IOPAMIDOL (ISOVUE-370) INJECTION 76% COMPARISON:  Head CT 06/30/2018 FINDINGS: CTA NECK FINDINGS AORTIC ARCH: There is no calcific atherosclerosis of the aortic arch. There is no aneurysm, dissection or hemodynamically significant stenosis of the visualized ascending aorta and aortic arch. Conventional 3 vessel aortic branching pattern. The visualized proximal subclavian arteries are widely patent. RIGHT CAROTID SYSTEM: --Common carotid artery: Widely patent origin without common carotid artery dissection or aneurysm. --Internal carotid artery: No dissection, occlusion or aneurysm. Mild atherosclerotic calcification  at the carotid bifurcation without hemodynamically significant stenosis. --External carotid artery: No acute abnormality. LEFT CAROTID SYSTEM: --Common carotid artery: Widely patent origin without common carotid artery dissection or aneurysm. --Internal carotid artery:No dissection, occlusion or aneurysm. Mild atherosclerotic calcification at the carotid bifurcation without hemodynamically significant stenosis. --External carotid artery: No acute abnormality. VERTEBRAL ARTERIES: Codominant configuration. Both origins are normal. No dissection, occlusion or flow-limiting stenosis to the vertebrobasilar confluence. SKELETON: There is no bony spinal canal stenosis. No lytic or blastic lesion. OTHER NECK: Normal pharynx, larynx and major salivary glands. No cervical lymphadenopathy. Unremarkable thyroid gland. UPPER CHEST: No pneumothorax or pleural effusion. No nodules or masses. CTA HEAD FINDINGS ANTERIOR CIRCULATION: --Intracranial internal carotid arteries: Normal. --Anterior cerebral arteries: Normal. Both A1 segments are present. Patent anterior communicating artery. --Middle cerebral arteries: Normal. --Posterior communicating arteries: Present bilaterally. POSTERIOR CIRCULATION: --Basilar artery: Normal. --Posterior cerebral arteries: Normal. --Superior cerebellar arteries: Normal. --Inferior cerebellar arteries: Normal anterior and posterior inferior cerebellar arteries. VENOUS SINUSES: As permitted by contrast timing, patent. ANATOMIC VARIANTS: None DELAYED PHASE: No parenchymal contrast enhancement. Left occipital lobe temporal encephalomalacia redemonstrated. Review of the MIP images confirms the above findings. IMPRESSION: 1. No emergent large vessel occlusion or hemodynamically significant stenosis of the head or neck. 2. Mild bilateral carotid bifurcation calcific atherosclerosis without hemodynamically significant stenosis. Electronically Signed   By: Ulyses Jarred M.D.   On: 06/30/2018 20:40   Ct Head  Code Stroke Wo Contrast  Result Date: 06/30/2018 CLINICAL DATA:  Code stroke. LEFT-sided weakness, RIGHT gaze. History of encephalitis. EXAM: CT HEAD WITHOUT CONTRAST TECHNIQUE: Contiguous axial images were obtained from the base of the skull through the vertex without intravenous contrast. COMPARISON:  None. FINDINGS: BRAIN: No intraparenchymal hemorrhage, mass effect or acute large vascular territory infarct. Confluent LEFT temporal occipital encephalomalacia lesion. Small area RIGHT temporal lobe encephalomalacia. 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 Mild global parenchymal brain volume loss. No abnormal extra-axial fluid collections. VASCULAR: Mild calcific atherosclerosis of the carotid siphons. No dense intracranial vessels. SKULL: No skull fracture. No significant scalp soft tissue swelling. SINUSES/ORBITS: Trace paranasal sinus mucosal thickening. Mastoid air cells are well aerated.The included ocular globes and orbital contents are non-suspicious. OTHER: None. ASPECTS [facility] Stroke Program Early CT Score) - Ganglionic level infarction (caudate, lentiform nuclei, internal capsule, insula, M1-M3 cortex): 7 - Supraganglionic infarction (M4-M6 cortex): 3 Total score (0-10 with 10 being normal): 10 IMPRESSION: 1. No acute intracranial process. 2. ASPECTS is 10. 3. 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 4. Critical Value/emergent results were called by telephone at the time of interpretation on 06/30/2018 at 7:57 pm to Dr. Gerlene Fee , who verbally acknowledged these results. Electronically Signed   By: Elon Alas M.D.   On: 06/30/2018 19:58    EKG: Independently reviewed.  EKG showed normal sinus rhythm with a rate of 94.  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  Patient will definitely need lumbar puncture.  This is been deferred until the morning as attempts twice here has failed.  Patient is not agitated and not responding to sedation.  We will treat empirically with combination of acyclovir, Rocephin 2 g as well as vancomycin.  Continue care according to neurology.  #2 recurrent seizures: Patient seizure today is different from her focal seizures.  It was generalized seizure observed in the ER.  We will continue Keppra and Vimpat.  Provide Ativan as needed for any seizures.  #3 leukocytosis and fever: Secondary to possible CNS infection.  We will continue close monitoring.  #4 history of depression: Currently on Viibryd. Continue when awake  DVT prophylaxis: SCD Code Status: Full code Family Communication: Husband at bedside Disposition Plan: To be determined Consults called: Neurology Dr. Malen Gauze Admission status: Inpatient  Severity of Illness: The appropriate patient status for this patient is INPATIENT. Inpatient status is judged to be reasonable and necessary in order to provide the required intensity of service to ensure the patient's safety. The patient's presenting symptoms, physical exam findings, and initial radiographic and laboratory data in the context of their chronic comorbidities is felt to place them at high risk for further clinical deterioration. Furthermore, it is not anticipated that the patient will be medically stable for discharge from the hospital within 2 midnights of admission. The following factors support the patient status of inpatient.   " The patient's presenting symptoms include seizure with altered mental status. " The worrisome physical exam findings include left-sided weakness with right word gaze and no fever. " The initial radiographic and laboratory data are worrisome  because of leukocytosis with high fever. " The chronic co-morbidities include seizure disorder.   * I certify that at the point of admission it is my clinical judgment that the patient will require inpatient hospital care spanning beyond 2 midnights from the point of admission due to high intensity of service, high risk for further deterioration and high frequency of surveillance required.Barbette Merino MD [facility] Pager [phone]  If 7PM-7AM, please contact night-coverage [email or web address] Password [facility]  06/30/2018, 11:34 PM

## 2018-06-30 NOTE — ED Triage Notes (Signed)
Husband states patient had a "small seizure" at 1630-"her normal small seizure that lasts ~5 seconds" and then period of disorientation for 1 1/2 minutes-he also states patient has no short term memory. Patient continued to have these "small seizures up until husband decided to come to the ED at 1920-states he lives 4 minutes away and noted that patient was becoming unresponsive-patient pulled out of car and had left facial drooping with bilateral gaze to right. Immediately seen by Dr. Sedonia Small and to CT for CTA-VAN positive.

## 2018-06-30 NOTE — ED Provider Notes (Signed)
[facility] Emergency Department Provider Note MRN:  [id number]  Arrival date & time: 07/01/18     Chief Complaint   Weakness, seizure  History of Present Illness   Mallory Boone is a 72 y.o. year-old female with a history of seizures, encephalitis presenting to the ED with chief complaint of weakness, seizure.  Per husband, patient normally has 2-3 focal seizures per week.  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  Earlier today, she experienced back to back for focal seizures, which is very abnormal for her.  Husband got on the car and was bringing her to the emergency department, when she had a full tonic-clonic seizure, which is very abnormal for her as well.  The seizure occurred at 7:20 PM.  Since that time she has had gaze preference to the right as well as left-sided weakness.  I was unable to obtain an accurate HPI, PMH, or ROS due to the patient's severity of condition.  Review of Systems  Positive for seizure, focal weakness  Patient's Health History    Past Medical History:  Diagnosis Date  . Depression   . Encephalitis   . Osteoporosis   . Seizures ([place])     Past Surgical History:  Procedure Laterality Date  . ABDOMINAL HYSTERECTOMY    . left lobe lobectomy    . SPLENECTOMY, TOTAL      No family history on file.  Social History   Socioeconomic History  . Marital status: Married    Spouse name: Not on file  . Number of children: Not on file  . Years of education: Not on file  . Highest education level: Not on file  Occupational History  . Not on file  Social Needs  . Financial resource strain: Not on file  . Food insecurity:    Worry: Not on file    Inability: Not on file  . Transportation needs:    Medical: Not on file    Non-medical: Not on file  Tobacco Use  . Smoking status: Never Smoker  . Smokeless tobacco: Never Used  Substance and Sexual Activity  . Alcohol use: Never    Frequency: Never  . Drug use: Never  . Sexual  activity: Yes    Birth control/protection: Post-menopausal  Lifestyle  . Physical activity:    Days per week: Not on file    Minutes per session: Not on file  . Stress: Not on file  Relationships  . Social connections:    Talks on phone: Not on file    Gets together: Not on file    Attends religious service: Not on file    Active member of club or organization: Not on file    Attends meetings of clubs or organizations: Not on file    Relationship status: Not on file  . Intimate partner violence:    Fear of current or ex partner: Not on file    Emotionally abused: Not on file    Physically abused: Not on file    Forced sexual activity: Not on file  Other Topics Concern  . Not on file  Social History Narrative  . Not on file     Physical Exam  Vital Signs and Nursing Notes reviewed Vitals:   07/01/18 0034 07/01/18 0039  BP: 115/70   Pulse: 86   Resp: 19   Temp:  98.2 F (36.8 C)  SpO2: 94%     CONSTITUTIONAL: Ill-appearing, NAD NEURO:  Alert and oriented x 3, left-sided  plegia, right gaze preference EYES:  eyes equal and reactive ENT/NECK:  no LAD, no JVD CARDIO: Regular rate, well-perfused, normal S1 and S2 PULM:  CTAB no wheezing or rhonchi GI/GU:  normal bowel sounds, non-distended, non-tender MSK/SPINE:  No gross deformities, no edema SKIN:  no rash, atraumatic PSYCH:  Appropriate speech and behavior  Diagnostic and Interventional Summary    EKG Interpretation  Date/Time:  Monday June 30 2018 19:32:46 EDT Ventricular Rate:  94 PR Interval:    QRS Duration: 96 QT Interval:  373 QTC Calculation: 469 R Axis:   -43 Text Interpretation:  Sinus rhythm Left axis deviation Confirmed by Gerlene Fee [phone]) on 06/30/2018 7:58:31 PM      Labs Reviewed  CBC - Abnormal; Notable for the following components:      Result Value   WBC 13.0 (*)    Platelets 406 (*)    All other components within normal limits  DIFFERENTIAL - Abnormal; Notable for the  following components:   Neutro Abs 9.5 (*)    All other components within normal limits  COMPREHENSIVE METABOLIC PANEL - Abnormal; Notable for the following components:   Glucose, Bld 165 (*)    All other components within normal limits  URINALYSIS, ROUTINE W REFLEX MICROSCOPIC - Abnormal; Notable for the following components:   pH 9.0 (*)    All other components within normal limits  I-STAT CHEM 8, ED - Abnormal; Notable for the following components:   Glucose, Bld 172 (*)    Calcium, Ion 1.09 (*)    All other components within normal limits  CBG MONITORING, ED - Abnormal; Notable for the following components:   Glucose-Capillary 151 (*)    All other components within normal limits  CULTURE, BLOOD (ROUTINE X 2)  CULTURE, BLOOD (ROUTINE X 2)  CSF CULTURE  GRAM STAIN  HSV CULTURE AND TYPING  ETHANOL  PROTIME-INR  APTT  RAPID URINE DRUG SCREEN, HOSP PERFORMED  CSF CELL COUNT WITH DIFFERENTIAL  CSF CELL COUNT WITH DIFFERENTIAL  GLUCOSE, CSF  PROTEIN, CSF  HERPES SIMPLEX VIRUS(HSV) DNA BY PCR  I-STAT TROPONIN, ED    DG Chest 2 View  Final Result    CT Angio Head W or Wo Contrast  Final Result    CT Angio Neck W and/or Wo Contrast  Final Result    CT HEAD CODE STROKE WO CONTRAST  Final Result    DG Lumbar Puncture Fluoro Guide    (Results Pending)    Medications  iopamidol (ISOVUE-370) 76 % injection (has no administration in time range)  acyclovir (ZOVIRAX) 945 mg in dextrose 5 % 150 mL IVPB (945 mg Intravenous New Bag/Given 07/01/18 0034)  midazolam (VERSED) injection 4 mg (0.5 mg Intravenous Given 06/30/18 2324)  vancomycin (VANCOCIN) 2,000 mg in sodium chloride 0.9 % 500 mL IVPB (has no administration in time range)  iopamidol (ISOVUE-370) 76 % injection 100 mL (100 mLs Intravenous Contrast Given 06/30/18 1956)  LORazepam (ATIVAN) injection 1 mg (1 mg Intravenous Given 06/30/18 2122)  sodium chloride 0.9 % bolus 500 mL (0 mLs Intravenous Stopped 06/30/18 2237)    phenytoin (DILANTIN) 1,000 mg in sodium chloride 0.9 % 250 mL IVPB ( Intravenous Stopped 06/30/18 2234)  ondansetron (ZOFRAN) injection 4 mg (4 mg Intravenous Given 06/30/18 2122)  cefTRIAXone (ROCEPHIN) 2 g in sodium chloride 0.9 % 100 mL IVPB ( Intravenous Stopped 07/01/18 0029)  acetaminophen (TYLENOL) tablet 650 mg (650 mg Oral Given 06/30/18 2239)  LORazepam (ATIVAN) injection 1 mg (1 mg Intravenous  Given 06/30/18 2221)  haloperidol lactate (HALDOL) injection 2 mg (2 mg Intravenous Given 06/30/18 2240)     .Lumbar Puncture Date/Time: 07/01/2018 12:51 AM Performed by: Maudie Flakes, MD Authorized by: Maudie Flakes, MD   Consent:    Consent obtained:  Verbal   Consent given by:  Spouse   Risks discussed:  Bleeding, infection and nerve damage Pre-procedure details:    Procedure purpose:  Diagnostic   Preparation: Patient was prepped and draped in usual sterile fashion   Sedation:    Sedation type:  Anxiolysis (midazolam pushes) Anesthesia (see MAR for exact dosages):    Anesthesia method:  Local infiltration   Local anesthetic:  Lidocaine 1% WITH epi Procedure details:    Lumbar space:  L4-L5 interspace   Patient position:  R lateral decubitus   Needle gauge:  18   Number of attempts:  2   Fluid appearance:  Bloody Post-procedure:    Puncture site:  Direct pressure applied   Patient tolerance of procedure:  Tolerated with difficulty Comments:     Patient with altered mental status, moving frequently during the attempt.  Unsuccessful.     Critical Care Critical Care Documentation Critical care time provided by me (excluding procedures): 55 minutes  Condition necessitating critical care: Code stroke protocol, concern for CNS infection  Components of critical care management: reviewing of prior records, laboratory and imaging interpretation, frequent re-examination and reassessment of vital signs, administration of IV antibiotics, IV antivirals, discussion with consulting  services    ED Course and Medical Decision Making  I have reviewed the triage vital signs and the nursing notes.  Pertinent labs & imaging results that were available during my care of the patient were reviewed by me and considered in my medical decision making (see below for details).  Concern for acute stroke versus Todd's paralysis in the 72 year old female with history of encephalitis, seizure disorder.  Code stroke protocol initiated, awaiting imaging.  Clinical Course as of Jul 01 53  Mon Jun 30, 2018  2010 Non con [facility] with no acute findings   [MB]  2010 CT HEAD CODE STROKE WO CONTRAST [MB]  2107 Discussed case with Dr. Rory Percy of neurology.  Patient is largely at baseline per family.  Neurological findings resolved.  No further evidence of seizure here in the ED.  Temperature 100.5 but not really specific per neurology, could easily be explained by the seizure itself.  No meningismus on exam.  Will hold off on meningitis/encephalitis antimicrobial coverage for now, will monitor for true fever during her ED visit.  Will obtain urinalysis.  If she re-spikes a fever without another obvious source of infection, or if she has further seizure activity, will then pursue lumbar puncture and CNS coverage.   [MB]    Clinical Course User Index [MB] Maudie Flakes, MD    Patient became febrile to 101.7, triggering LP attempt which was unsuccessful.  Provided with empiric CNS coverage, vancomycin, ceftriaxone, acyclovir.  Admitted to hospitalist service, patient not at her baseline, to be transferred to [facility].  Barth Kirks. Sedonia Small, [place] [email or web address]  Final Clinical Impressions(s) / ED Diagnoses     ICD-10-CM   1. Seizures ([place]) R56.9   2. Fever R50.9 DG Chest 2 View    DG Chest 2 View    ED Discharge Orders    None         Maudie Flakes, MD 07/01/18 [phone]

## 2018-06-30 NOTE — ED Notes (Signed)
Husband states symptoms started more around 1530-1545-TPA has been cancelled

## 2018-06-30 NOTE — ED Notes (Signed)
Clothing and eye glasses given to husband. Patient retains bilateral hearing aids.

## 2018-06-30 NOTE — Consult Note (Signed)
[facility] TeleNeurology Consult Services   Date of Service:   06/30/2018 19:40:56  Impression:     .  RO Acute Ischemic Stroke  Comments: the patient initially apparently had some left facial weakness and also a left hemi-neglect. She also had a right gaze preference. She is able to cross midline by the end of examination and did not have any obvious clinical seizures by the end of neurologic assessment. Her last known normal was difficult to pin down with family but ultimately she did not fall and acute thrombolytic therapy window. Her CT Angela head and neck did not show any large vessel occlusion. Recommend starting aspirin and admitting for stroke/seizure workup. Suspect seizure with postictal state more likely than stroke given her history and multiple seizures during this timeframe. Recommend Dilantin load 10 mg/kg continue Keppra and Vimpat which are already at fairly high doses. She'll need inpatient neurology consultation seizure precautions.  Metrics: Last Known Well: 07/01/2018 15:45:00 [facility] Notification Time: 06/30/2018 19:40:26 Arrival Time: 07/01/2018 19:28:00 Stamp Time: 06/30/2018 19:40:56 Time First Login Attempt: 07/01/2018 19:46:00 Video Start Time: 07/01/2018 19:46:00  Symptoms: confusion and left facial droop seizures NIHSS Start Assessment Time: 07/01/2018 20:10:00 Patient is not a candidate for tPA. Patient was not deemed candidate for tPA thrombolytics because of Last Well Known Above 4.5 Hours. Video End Time: 07/01/2018 20:30:00  CT head showed no acute hemorrhage or acute core infarct.  Advanced imaging was reviewed, No Indication of Large Vessel Occlusive Thrombus.  ER physician notified of the decision on thrombolytics management.  Our recommendations are outlined below.  Recommendations:     .  Antiplatelet Therapy Recommended   Sign Out:     .  Discussed with Emergency Department  Provider    ------------------------------------------------------------------------------  History of Present Illness: Patient is a 72 years old Female.  Patient was brought by EMS for symptoms of confusion and left facial droop seizures  the patient is a 72 year old woman with a history of known seizure disorder who has bilateral encephalomalacia of the temporal lobes. She had a prior neuroendocrine tumor of the lung resected. Her husband says she has about 3 brief seizures a week which typically only last a couple seconds. They're characterized by her repeating the sentence on okay I'm okay and sometimes with some rocking back and forth. Today she had multiple seizures beginning ataround 4 PM. He said she seemed okay sometime between 15:30 and 15:45. She then had multiple seizures back-to-back which are lasting longer with more prolonged confusion afterwards. She then had a bigger seizure lasting several minutes in the car. She takes Keppra 500 mg extended release 4 tablets a day in addition to Vimpat 100 mg twice a day. Her CT of the head without contrast did not show any acute changes, her CT Andrew head and neck did not show any large vessel occlusion.  CT head showed no acute hemorrhage or acute core infarct.   Examination: 1A: Level of Consciousness - Alert; keenly responsive + 0 1B: Ask Month and Age - Aphasic + 2 1C: Blink Eyes & Squeeze Hands - Performs Both Tasks + 0 2: Test Horizontal Extraocular Movements - Partial Gaze Palsy: Can Be Overcome + 1 3: Test Visual Fields - No Visual Loss + 0 4: Test Facial Palsy (Use Grimace if Obtunded) - Normal symmetry + 0 5A: Test Left Arm Motor Drift - No Drift for 10 Seconds + 0 5B: Test Right Arm Motor Drift - No Drift for 10 Seconds + 0 6A: Test  Left Leg Motor Drift - No Drift for 5 Seconds + 0 6B: Test Right Leg Motor Drift - No Drift for 5 Seconds + 0 7: Test Limb Ataxia (FNF/Heel-Shin) - No Ataxia + 0 8: Test Sensation - Normal; No  sensory loss + 0 9: Test Language/Aphasia - Severe Aphasia: Fragmentary Expression, Inference Needed, Cannot Identify Materials + 2 10: Test Dysarthria - Mild-Moderate Dysarthria: Slurring but can be understood + 1 11: Test Extinction/Inattention - Visual/tactile/auditory/spatial/personal inattention + 1  NIHSS Score: 7  Patient was informed the Neurology Consult would happen via TeleHealth consult by way of interactive audio and video telecommunications and consented to receiving care in this manner.  Due to the immediate potential for life-threatening deterioration due to underlying acute neurologic illness, I spent 35 minutes providing critical care. This time includes time for face to face visit via telemedicine, review of medical records, imaging studies and discussion of findings with providers, the patient and/or family.   Dr Katina Degree   [facility] [phone]

## 2018-07-01 ENCOUNTER — Inpatient Hospital Stay (HOSPITAL_COMMUNITY): Payer: Medicare Other

## 2018-07-01 DIAGNOSIS — G0490 Encephalitis and encephalomyelitis, unspecified: Secondary | ICD-10-CM

## 2018-07-01 DIAGNOSIS — E876 Hypokalemia: Secondary | ICD-10-CM

## 2018-07-01 DIAGNOSIS — G9340 Encephalopathy, unspecified: Secondary | ICD-10-CM

## 2018-07-01 DIAGNOSIS — R569 Unspecified convulsions: Secondary | ICD-10-CM

## 2018-07-01 LAB — CSF CELL COUNT WITH DIFFERENTIAL
RBC Count, CSF: 21 /mm3 — ABNORMAL HIGH
Tube #: 1
WBC, CSF: 1 /mm3 (ref 0–5)

## 2018-07-01 LAB — CBC
HEMATOCRIT: 39.1 % (ref 36.0–46.0)
Hemoglobin: 12.4 g/dL (ref 12.0–15.0)
MCH: 29.4 pg (ref 26.0–34.0)
MCHC: 31.7 g/dL (ref 30.0–36.0)
MCV: 92.7 fL (ref 78.0–100.0)
PLATELETS: 307 10*3/uL (ref 150–400)
RBC: 4.22 MIL/uL (ref 3.87–5.11)
RDW: 13.4 % (ref 11.5–15.5)
WBC: 16.7 10*3/uL — AB (ref 4.0–10.5)

## 2018-07-01 LAB — INFLUENZA PANEL BY PCR (TYPE A & B)
INFLAPCR: NEGATIVE
Influenza B By PCR: NEGATIVE

## 2018-07-01 LAB — COMPREHENSIVE METABOLIC PANEL
ALT: 13 U/L (ref 0–44)
ANION GAP: 9 (ref 5–15)
AST: 17 U/L (ref 15–41)
Albumin: 3 g/dL — ABNORMAL LOW (ref 3.5–5.0)
Alkaline Phosphatase: 60 U/L (ref 38–126)
BUN: 12 mg/dL (ref 8–23)
CHLORIDE: 105 mmol/L (ref 98–111)
CO2: 24 mmol/L (ref 22–32)
Calcium: 8 mg/dL — ABNORMAL LOW (ref 8.9–10.3)
Creatinine, Ser: 0.69 mg/dL (ref 0.44–1.00)
GFR calc Af Amer: >60 mL/min (ref >60)
Glucose, Bld: 94 mg/dL (ref 70–99)
POTASSIUM: 3.2 mmol/L — AB (ref 3.5–5.1)
Sodium: 138 mmol/L (ref 135–145)
Total Bilirubin: 0.7 mg/dL (ref 0.3–1.2)
Total Protein: 6 g/dL — ABNORMAL LOW (ref 6.5–8.1)

## 2018-07-01 LAB — PROTEIN, CSF: Total  Protein, CSF: 45 mg/dL (ref 15–45)

## 2018-07-01 LAB — GLUCOSE, CSF: Glucose, CSF: 65 mg/dL (ref 40–70)

## 2018-07-01 LAB — TSH: TSH: 0.377 u[IU]/mL (ref 0.350–4.500)

## 2018-07-01 MED ORDER — VANCOMYCIN HCL 10 G IV SOLR
1750.0000 mg | INTRAVENOUS | Status: DC
  Administered 2018-07-02: 1750 mg via INTRAVENOUS
  Filled 2018-07-01: qty 1750

## 2018-07-01 MED ORDER — VANCOMYCIN HCL 10 G IV SOLR: 1750.0000 mg | INTRAVENOUS | Status: DC

## 2018-07-01 MED ORDER — LEVETIRACETAM ER 500 MG PO TB24
1000.0000 mg | ORAL_TABLET | Freq: Every day | ORAL | Status: DC
  Administered 2018-07-01 – 2018-07-02 (×2): 1000 mg via ORAL
  Filled 2018-07-01 (×2): qty 2

## 2018-07-01 MED ORDER — SODIUM CHLORIDE 0.9 % IV SOLN
2.0000 g | INTRAVENOUS | Status: DC
  Administered 2018-07-01 – 2018-07-02 (×5): 2 g via INTRAVENOUS
  Filled 2018-07-01 (×7): qty 2000

## 2018-07-01 MED ORDER — DEXTROSE 5 % IV SOLN
5.0000 mg/kg | Freq: Two times a day (BID) | INTRAVENOUS | Status: DC
  Administered 2018-07-01: 470 mg via INTRAVENOUS
  Filled 2018-07-01: qty 9.4

## 2018-07-01 MED ORDER — VILAZODONE HCL 20 MG PO TABS
20.0000 mg | ORAL_TABLET | Freq: Every day | ORAL | Status: DC
  Administered 2018-07-01 – 2018-07-03 (×3): 20 mg via ORAL
  Filled 2018-07-01 (×3): qty 1

## 2018-07-01 MED ORDER — SODIUM CHLORIDE 0.45 % IV SOLN
INTRAVENOUS | Status: DC
  Administered 2018-07-01: 03:00:00 via INTRAVENOUS

## 2018-07-01 MED ORDER — ONDANSETRON HCL 4 MG PO TABS: 4.0000 mg | ORAL_TABLET | Freq: Four times a day (QID) | ORAL | Status: DC | PRN

## 2018-07-01 MED ORDER — POTASSIUM CHLORIDE CRYS ER 20 MEQ PO TBCR
40.0000 meq | EXTENDED_RELEASE_TABLET | Freq: Once | ORAL | Status: AC
  Administered 2018-07-01: 40 meq via ORAL
  Filled 2018-07-01: qty 2

## 2018-07-01 MED ORDER — DEXTROSE 5 % IV SOLN
5.0000 mg/kg | Freq: Two times a day (BID) | INTRAVENOUS | Status: DC
  Filled 2018-07-01 (×2): qty 9.4

## 2018-07-01 MED ORDER — SODIUM CHLORIDE 0.9 % IV SOLN: 2.0000 g | INTRAVENOUS | Status: DC

## 2018-07-01 MED ORDER — CALCIUM CARBONATE-VITAMIN D 500-200 MG-UNIT PO TABS
1.0000 | ORAL_TABLET | Freq: Two times a day (BID) | ORAL | Status: DC
  Administered 2018-07-01 – 2018-07-03 (×5): 1 via ORAL
  Filled 2018-07-01 (×5): qty 1

## 2018-07-01 MED ORDER — LACOSAMIDE 50 MG PO TABS
100.0000 mg | ORAL_TABLET | Freq: Two times a day (BID) | ORAL | Status: DC
  Administered 2018-07-01 – 2018-07-03 (×5): 100 mg via ORAL
  Filled 2018-07-01 (×5): qty 2

## 2018-07-01 MED ORDER — ALENDRONATE SODIUM 70 MG PO TABS: 70.0000 mg | ORAL_TABLET | ORAL | Status: DC

## 2018-07-01 MED ORDER — ONDANSETRON HCL 4 MG/2ML IJ SOLN
4.0000 mg | Freq: Four times a day (QID) | INTRAMUSCULAR | Status: DC | PRN
  Administered 2018-07-02: 4 mg via INTRAVENOUS
  Filled 2018-07-01: qty 2

## 2018-07-01 MED ORDER — ACETAMINOPHEN 325 MG PO TABS
650.0000 mg | ORAL_TABLET | Freq: Four times a day (QID) | ORAL | Status: DC | PRN
  Administered 2018-07-02: 650 mg via ORAL
  Filled 2018-07-01: qty 2

## 2018-07-01 MED ORDER — SODIUM CHLORIDE 0.9 % IV SOLN
2.0000 g | Freq: Two times a day (BID) | INTRAVENOUS | Status: DC
  Administered 2018-07-01 (×2): 2 g via INTRAVENOUS
  Filled 2018-07-01 (×3): qty 20

## 2018-07-01 MED ORDER — LIDOCAINE HCL (PF) 1 % IJ SOLN
5.0000 mL | Freq: Once | INTRAMUSCULAR | Status: AC
  Administered 2018-07-01: 5 mL via INTRADERMAL

## 2018-07-01 MED ORDER — DEXTROSE 5 % IV SOLN
600.0000 mg | Freq: Three times a day (TID) | INTRAVENOUS | Status: DC
  Administered 2018-07-01 – 2018-07-03 (×6): 600 mg via INTRAVENOUS
  Filled 2018-07-01 (×7): qty 12

## 2018-07-01 NOTE — Plan of Care (Signed)
Patient has been very sleepy through night.  She has not had any seizers to this point.  Have added a tele sitter to patients room due to her having short term memory loss and having pullout two IV's prior to arrival to the unit.

## 2018-07-01 NOTE — Consult Note (Signed)
Neurology Consultation  Reason for Consult: Seizure Referring Physician: Dr. Sedonia Small  CC: Seizure  History is obtained from: Chart, patient's husband  HPI: Mallory Boone is a 72 y.o. 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 Delice Lesch, brought in to the emergency room by the husband because of episodes of seizure-like activity. He describes that the patient had an episode of what appeared to be mild shaking generalized tonic-clonic activity and had a right gaze preference and left-sided weakness.  She was initially evaluated in the emergency room as an acute code stroke and was determined that this was not a stroke and she was having seizure activity. She was loaded with Dilantin x1 IV.  She also spiked a temperature in the emergency room.  The ED provider attempted a bedside spinal tap and was unsuccessful. She has been admitted to the hospitalist with empiric antiviral and meningitic antibiotic coverage. Patient was unable to provide history.  Has been provided most of the history. Husband says that today was much different than her usual seizures-usual seizures are focal seizures with impaired awareness with amnesia for the events without any prior warning symptoms. She usually has seizures that start with her rocking and grimacing and perseverating saying I am okay/it is okay for about a few seconds and which follows a duration of disorientation disorientation. Today she had episodes which were different than her usual focal seizures including some generalized tonic-clonic activity and gaze preference.  She has not had a generalized tonic-clonic seizure since the HSV encephalitis in 1986. At the time of this  encounter, the patient was drowsy, easily arousable and follows all commands. No other source of infection was identified and presumptive treatment for minimum encephalitis including antivirals and antibiotics was initiated.   LKW: 3 PM on 06/30/2018 tpa given?: no, outside the window for thrombolytics Premorbid modified Rankin scale (mRS): 2 -husband does the finances, she requires instructions in written journals but does her medications are soft.  ROS: Unable to obtain due to altered mental status per husband denies any preceding illness or sickness prior to this presentation  Past Medical History:  Diagnosis Date  . Depression   . Encephalitis   . Osteoporosis   . Seizures ([place])    No family history on file.  Social History:   reports that she has never smoked. She has never used smokeless tobacco. She reports that she does not drink alcohol or use drugs.  Medications  Current Facility-Administered Medications:  .  acyclovir (ZOVIRAX) 945 mg in dextrose 5 % 150 mL IVPB, 10 mg/kg, Intravenous, Once, Maudie Flakes, MD, Last Rate: 168.9 mL/hr at 07/01/18 0034, 945 mg at 07/01/18 0034 .  iopamidol (ISOVUE-370) 76 % injection, , , ,  .  midazolam (VERSED) injection 4 mg, 4 mg, Intravenous, PRN, Maudie Flakes, MD, 0.5 mg at 06/30/18 2324 .  vancomycin (VANCOCIN) 2,000 mg in sodium chloride 0.9 % 500 mL IVPB, 2,000 mg, Intravenous, Once, Elwyn Reach, MD  Current Outpatient Medications:  .  alendronate (FOSAMAX) 70 MG tablet, Take 70 mg by mouth once a week. Take with a full glass of water on an empty stomach., Disp: , Rfl:  .  calcium-vitamin D (OSCAL WITH  D) 500-200 MG-UNIT tablet, Take 1 tablet by mouth., Disp: , Rfl:  .  Lacosamide 100 MG TABS, Take 1 tablet twice a day, Disp: 90 tablet, Rfl: 3 .  levETIRAcetam (KEPPRA XR) 500 MG 24 hr tablet, Take 4 tablets every night, Disp: 360 tablet, Rfl: 3 .  Vilazodone HCl (VIIBRYD) 20 MG TABS, Take 20 mg by mouth daily. , Disp: ,  Rfl:   Exam: Current vital signs: BP 115/70 (BP Location: Left Arm)   Pulse 86   Temp 98.2 F (36.8 C) (Oral)   Resp 19   Ht 5\' 6"  (1.676 m)   Wt 94.4 kg   SpO2 94%   BMI 33.59 kg/m  Vital signs in last 24 hours: Temp:  [98.2 F (36.8 C)-101.7 F (38.7 C)] 98.2 F (36.8 C) (09/24 0039) Pulse Rate:  [80-90] 86 (09/24 0034) Resp:  [19-22] 19 (09/24 0034) BP: (115-170)/(53-82) 115/70 (09/24 0034) SpO2:  [93 %-99 %] 94 % (09/24 0034) Weight:  [94.4 kg-98 kg] 94.4 kg (09/23 2027)  T-max 101.7, afebrile at the time of examination GENERAL: Sleepy, easily arousable, follows all commands once aroused, no apparent distress HEENT: - Normocephalic and atraumatic, dry mm, no LN++, no Thyromegally LUNGS - Clear to auscultation bilaterally with no wheezes CV - S1S2 RRR, no m/r/g, equal pulses bilaterally. ABDOMEN - Soft, nontender, nondistended with normoactive BS Ext: warm, well perfused, intact peripheral pulses, no edema  NEURO:  Mental Status: Sleepy, arousable to voice, follows all commands.  Oriented to self.  On asking where she is, she said in a nursing home.  On asking her what brought her here she said her husband.  She also went on to say that she is being brought into the nursing home to live in the nursing home going forward by her husband. Poor attention concentration Language: speech is mildly dysarthric.  She was able to name simple objects.  She was able to repeat.  She follows all commands. Cranial Nerves: PERRL. EOMI, visual fields full to threat, no facial asymmetry, facial sensation intact, hearing intact, tongue/uvula/soft palate midline, normal sternocleidomastoid and trapezius muscle strength. No evidence of tongue atrophy or fibrillations Motor: Symmetric 5/5 antigravity in all 4 extremities without drift Tone: is normal and bulk is normal Sensation- Intact to light touch bilaterally Coordination: Difficult to assess because of poor attention concentration Gait-  deferred  NIHSS-2 for LOC questions  Labs I have reviewed labs in epic and the results pertinent to this consultation are: CBC    Component Value Date/Time   WBC 13.0 (H) 06/30/2018 1958   RBC 4.68 06/30/2018 1958   HGB 12.6 06/30/2018 2004   HCT 37.0 06/30/2018 2004   PLT 406 (H) 06/30/2018 1958   MCV 91.9 06/30/2018 1958   MCH 30.1 06/30/2018 1958   MCHC 32.8 06/30/2018 1958   RDW 13.7 06/30/2018 1958   LYMPHSABS 2.8 06/30/2018 1958   MONOABS 0.7 06/30/2018 1958   EOSABS 0.0 06/30/2018 1958   BASOSABS 0.1 06/30/2018 1958    CMP     Component Value Date/Time   NA 138 06/30/2018 2004   K 4.9 06/30/2018 2004   CL 104 06/30/2018 2004   CO2 23 06/30/2018 1958   GLUCOSE 172 (H) 06/30/2018 2004   BUN 18 06/30/2018 2004   CREATININE 0.60 06/30/2018 2004   CALCIUM 9.2 06/30/2018 1958   PROT 7.2 06/30/2018 1958   ALBUMIN 3.5 06/30/2018 1958   AST 33 06/30/2018 1958   ALT 12 06/30/2018 1958   ALKPHOS  67 06/30/2018 1958   BILITOT 1.2 06/30/2018 1958   GFRNONAA >60 06/30/2018 1958   GFRAA >60 06/30/2018 1958  Urinalysis unremarkable for evidence of UTI.  Imaging I have reviewed the images obtained: CT-scan of the brain-no acute changes.  Left temporal encephalomalacia.  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 Patient continues to be drowsy, likely secondary to medication versus prolonged postictal state. Of concern, she spiked a temperature of 101.7 Fahrenheit. A bedside spinal tap was attempted and was unsuccessful. Empirically started on meningitic doses of antibiotics and acyclovir for HSV coverage.  Impression: Breakthrough  seizure Evaluate for underlying meningo-encephalitis History of HSV encephalitis in 1986 with resultant left temporal encephalomalacia and recurrent focal seizures  Recommendations: -Admit patient to [facility] under Hospitalist service.  If recurrent seizures happen, might need stat EEG and LTM-facilities that are not available at [facility] at this time of the night and during the day. -Routine EEG in the morning otherwise. -Increase Keppra to 1250 mg twice daily-total daily dose of 2500 mg -Continue with Vimpat 100 twice daily.  Can increase dosage if seizure activity recurs or concern for status. -Ativan for seizure activity more than 5 minutes. -Maintain seizure precautions -Spinal tap under fluoroscopy guidance-sent for cell count, glucose, protein, HSV PCR, fungal cultures, bacterial cultures and Gram stain. -Supportive treatment per medicine or as you are. -I have relayed my plan to the admitting hospitalist, ED provider and the patient's husband at bedside. -Neurology will continue to follow the patient. -Please call with questions.  -- Amie Portland, MD [facility] Pager: [phone] If 7pm to 7am, please call on call as listed on AMION.

## 2018-07-01 NOTE — Progress Notes (Signed)
Bedside EEG completed; results pending. 

## 2018-07-01 NOTE — Care Management Note (Signed)
Case Management Note  Patient Details  Name: Mallory Boone MRN: [id number] Date of Birth: 1946/02/08  Subjective/Objective:     Pt admitted with encephalopathy. She is from home with spouse.                Action/Plan: Plan is for d/c home when medically ready. CM following for d/c needs.   Expected Discharge Date:                  Expected Discharge Plan:  Home/Self Care  In-House Referral:     Discharge planning Services     Post Acute Care Choice:    Choice offered to:     DME Arranged:    DME Agency:     HH Arranged:    HH Agency:     Status of Service:  In process, will continue to follow  If discussed at Long Length of Stay Meetings, dates discussed:    Additional Comments:  Pollie Friar, RN 07/01/2018, 3:48 PM

## 2018-07-01 NOTE — Progress Notes (Signed)
Pt back to room from procedure; pt alert and verbally responsive; back LP site has clean, dry and intact band aide with no stain or active drainage noted. Pt advised and educated to remain flat in bed for at least 4hrs. Both pt and spouse voices understanding. VSS; call light within reach and spouse remains at bedside. Will continue to closely monitor. Delia Heady RN

## 2018-07-01 NOTE — Progress Notes (Signed)
Pt transported off unit to fluoro for procedure. Delia Heady RN

## 2018-07-01 NOTE — Progress Notes (Signed)
PROGRESS NOTE    Mallory Boone  QIO:[id number] DOB: 1945/12/21 DOA: 06/30/2018 PCP: Patient, No Pcp Per      Brief Narrative:  Mallory Boone is a 72 y.o. 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  Patient had been in normal health until day of admission, family noticed that she was having multiple of her typical partial seizures, progressively more frequent until evening when she had a generalized tonic-clonic seizure and was brought tot he ER.  There was some initial concern for stroke, but CT head and CTA head and neck were normal and it was determined to be Todd paralysis.  She was loaded with Dilantin.  Subsequently noted to have fever to 101.3F, LP failed in ER, started empirically on acyclovir, ceftriaxone and Vancomycin.        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    Obtani glucose, protein, CSF culture.    Obtain HSV PCR, enterovirus PCR.    Reserve 1 tube fluid for possible Arbovirus testing if all other testing non-illustrative. -Will obtain MRI w and wo contrast AFTER LP, to evaluate for temporal lobe changes    Seizures -Continue home Vimpat and Keppra -Consult Nephrology, appreciate cares  Other medcaitions -Continue vilazodone  Hypokalemia -Supplement K       DVT prophylaxis: SCDs Code Status: FULL Family Communication: Husband and daughter at bedside MDM and disposition Plan: The below labs and imaging reports were reviewed and summarized above.  Medication management as above.  The patient was admitted with fever and seizure and headache.  Suspect HSV recurrence,  less likely bacterial meningitis, although this cannot be ruled out without LP.  This is potentially a life threatening infection, and extensive further work up and treatments are necessary, in the hospital.  At this time, continued inpatient services are reasonable and expected, given the patient's: Severity of presentation: seizure, fever, elevated WBC, confusion, age, ongoing IV antibiotics, and the high likelihood of an adverse outcome, including readmission, debility or death if the patient were to be discharged prematurely.     Consultants:   Neurology  Procedures:   LP pending  CT head and CTA head and neck  Antimicrobials:   Vancomycin 9/23 >>  Ceftriaxone 9/23 >>  Acyclovir 9./23 >  Ampicillin 9/24 >>    Subjective: Headache, neck ache in posterior.  No vision changes, mild photophobia.  Tired.  No chest pain, dyspnea, cough, urinary symptoms.  Objective: Vitals:   07/01/18 0039 07/01/18 0100 07/01/18 0224 07/01/18 0800  BP:  (!) 109/58 (!) 116/56 (!) 104/48  Pulse:  95 83 77  Resp:  17 20 18   Temp: 98.2 F (36.8 C)  99.1 F (37.3 C)   TempSrc: Oral  Oral   SpO2:  93% 98% 94%  Weight:   98.7 kg   Height:        Intake/Output Summary (Last 24 hours) at 07/01/2018 1446 Last data filed at 07/01/2018 0139 Gross per 24 hour  Intake 1084.59 ml  Output -  Net 1084.59 ml   Filed Weights   06/30/18 1946 06/30/18 2027 07/01/18 0224  Weight: 98 kg 94.4 kg 98.7 kg    Examination: General appearance: Overweight adult female,  sleeping but rousable, oriented to hospital only, not place or year.  HEENT: Anicteric, conjunctiva pink, lids and lashes normal. No nasal deformity, discharge, epistaxis.  Lips moist, teeth normal, OP moist, no oral lesions, hearing normal.   Skin: Warm and dry.  No jaundice.  Question nonblanching petechiae on right hand, no lesions on feet either side, left hand, no other rashes, on face, nose, ears, neck, uper chest, arms, or  legs. Cardiac: RRR, nl S1-S2, no murmurs appreciated.  Capillary refill is brisk.  JVP not visible.  No LE edema.  Radia  pulses 2+ and symmetric. Respiratory: Normal respiratory rate and rhythm.  CTAB without rales or wheezes. Abdomen: Abdomen soft.  No TTP. No ascites, distension, hepatosplenomegaly.   MSK: No deformities or effusions. Neuro: Awake but falls back asleep.  EOMI, moves all extremities with normal strength and coordination. Speech fluent.    Psych: Sensorium intact and responding to questions, attention diminished. Affect blunted.  Judgment and insight appear impaired.  Per husband, at baseline she is confused about year, month and city.    Data Reviewed: I have personally reviewed following labs and imaging studies:  CBC: Recent Labs  Lab 06/30/18 1958 06/30/18 2004 07/01/18 0607  WBC 13.0*  --  16.7*  NEUTROABS 9.5*  --   --   HGB 14.1 12.6 12.4  HCT 43.0 37.0 39.1  MCV 91.9  --  92.7  PLT 406*  --  308   Basic Metabolic Panel: Recent Labs  Lab 06/30/18 1958 06/30/18 2004 07/01/18 0607  NA 138 138 138  K 4.9 4.9 3.2*  CL 103 104 105  CO2 23  --  24  GLUCOSE 165* 172* 94  BUN 15 18 12   CREATININE 0.78 0.60 0.69  CALCIUM 9.2  --  8.0*   GFR: Estimated Creatinine Clearance: 74.3 mL/min (by C-G formula based on SCr of 0.69 mg/dL). Liver Function Tests: Recent Labs  Lab 06/30/18 1958 07/01/18 0607  AST 33 17  ALT 12 13  ALKPHOS 67 60  BILITOT 1.2 0.7  PROT 7.2 6.0*  ALBUMIN 3.5 3.0*   No results for input(s): LIPASE, AMYLASE in the last 168 hours. No results for input(s): AMMONIA in the last 168 hours. Coagulation Profile: Recent Labs  Lab 06/30/18 1958  INR 0.93   Cardiac Enzymes: No results for input(s): CKTOTAL, CKMB, CKMBINDEX, TROPONINI in the last 168 hours. BNP (last 3 results) No results for input(s): PROBNP in the last 8760 hours. HbA1C: No results for input(s): HGBA1C in the last 72 hours. CBG: Recent Labs  Lab  06/30/18 2018  GLUCAP 151*   Lipid Profile: No results for input(s): CHOL, HDL, LDLCALC, TRIG, CHOLHDL, LDLDIRECT in the last 72 hours. Thyroid Function Tests: Recent Labs    07/01/18 0607  TSH 0.377   Anemia Panel: No results for input(s): VITAMINB12, FOLATE, FERRITIN, TIBC, IRON, RETICCTPCT in the last 72 hours. Urine analysis:    Component Value Date/Time   COLORURINE YELLOW 06/30/2018 2044   APPEARANCEUR CLEAR 06/30/2018 2044   LABSPEC 1.030 06/30/2018 2044   PHURINE 9.0 (H) 06/30/2018 2044   GLUCOSEU NEGATIVE 06/30/2018 2044   HGBUR NEGATIVE 06/30/2018 2044   BILIRUBINUR NEGATIVE 06/30/2018 2044   KETONESUR NEGATIVE 06/30/2018 2044   PROTEINUR NEGATIVE 06/30/2018 2044   NITRITE NEGATIVE 06/30/2018 2044   LEUKOCYTESUR NEGATIVE 06/30/2018 2044   Sepsis Labs: @LABRCNTIP (procalcitonin:4,lacticacidven:4)  ) Recent Results (from the past 240 hour(s))  Blood culture (routine x 2)     Status: None (Preliminary result)  Collection Time: 06/30/18  9:03 PM  Result Value Ref Range Status   Specimen Description   Final    BLOOD LEFT HAND Performed at [facility] [address], [place], [place] [address]    Special Requests   Final    BOTTLES DRAWN AEROBIC AND ANAEROBIC Blood Culture adequate volume Performed at [facility] [address] [place], [place] [address]    Culture   Final    NO GROWTH < 24 HOURS Performed at [facility] [address] [place], [place] [address]    Report Status PENDING  Incomplete         Radiology Studies: Ct Angio Head W Or Wo Contrast  Result Date: 06/30/2018 CLINICAL DATA:  Seizure EXAM: CT ANGIOGRAPHY HEAD AND NECK TECHNIQUE: Multidetector CT imaging of the head and neck was performed using the standard protocol during bolus administration of intravenous contrast. Multiplanar CT image reconstructions and MIPs were obtained to evaluate the vascular anatomy. Carotid stenosis  measurements (when applicable) are obtained utilizing NASCET criteria, using the distal internal carotid diameter as the denominator. CONTRAST:  14mL ISOVUE-370 IOPAMIDOL (ISOVUE-370) INJECTION 76% COMPARISON:  Head CT 06/30/2018 FINDINGS: CTA NECK FINDINGS AORTIC ARCH: There is no calcific atherosclerosis of the aortic arch. There is no aneurysm, dissection or hemodynamically significant stenosis of the visualized ascending aorta and aortic arch. Conventional 3 vessel aortic branching pattern. The visualized proximal subclavian arteries are widely patent. RIGHT CAROTID SYSTEM: --Common carotid artery: Widely patent origin without common carotid artery dissection or aneurysm. --Internal carotid artery: No dissection, occlusion or aneurysm. Mild atherosclerotic calcification at the carotid bifurcation without hemodynamically significant stenosis. --External carotid artery: No acute abnormality. LEFT CAROTID SYSTEM: --Common carotid artery: Widely patent origin without common carotid artery dissection or aneurysm. --Internal carotid artery:No dissection, occlusion or aneurysm. Mild atherosclerotic calcification at the carotid bifurcation without hemodynamically significant stenosis. --External carotid artery: No acute abnormality. VERTEBRAL ARTERIES: Codominant configuration. Both origins are normal. No dissection, occlusion or flow-limiting stenosis to the vertebrobasilar confluence. SKELETON: There is no bony spinal canal stenosis. No lytic or blastic lesion. OTHER NECK: Normal pharynx, larynx and major salivary glands. No cervical lymphadenopathy. Unremarkable thyroid gland. UPPER CHEST: No pneumothorax or pleural effusion. No nodules or masses. CTA HEAD FINDINGS ANTERIOR CIRCULATION: --Intracranial internal carotid arteries: Normal. --Anterior cerebral arteries: Normal. Both A1 segments are present. Patent anterior communicating artery. --Middle cerebral arteries: Normal. --Posterior communicating arteries:  Present bilaterally. POSTERIOR CIRCULATION: --Basilar artery: Normal. --Posterior cerebral arteries: Normal. --Superior cerebellar arteries: Normal. --Inferior cerebellar arteries: Normal anterior and posterior inferior cerebellar arteries. VENOUS SINUSES: As permitted by contrast timing, patent. ANATOMIC VARIANTS: None DELAYED PHASE: No parenchymal contrast enhancement. Left occipital lobe temporal encephalomalacia redemonstrated. Review of the MIP images confirms the above findings. IMPRESSION: 1. No emergent large vessel occlusion or hemodynamically significant stenosis of the head or neck. 2. Mild bilateral carotid bifurcation calcific atherosclerosis without hemodynamically significant stenosis. Electronically Signed   By: Ulyses Jarred M.D.   On: 06/30/2018 20:40   Dg Chest 2 View  Result Date: 06/30/2018 CLINICAL DATA:  Seizure at 1630 hours. EXAM: CHEST - 2 VIEW COMPARISON:  None. FINDINGS: Mild pulmonary vascular congestion without alveolar consolidation, effusion or pneumothorax. Heart size is top-normal. There is moderate aortic atherosclerosis. Surgical clips project over the AP window. No acute fracture nor joint dislocations are apparent. IMPRESSION: Aortic atherosclerosis. Pulmonary vascular congestion consistent with mild CHF. Electronically Signed   By: Ashley Royalty M.D.   On:  06/30/2018 22:27   Ct Angio Neck W And/or Wo Contrast  Result Date: 06/30/2018 CLINICAL DATA:  Seizure EXAM: CT ANGIOGRAPHY HEAD AND NECK TECHNIQUE: Multidetector CT imaging of the head and neck was performed using the standard protocol during bolus administration of intravenous contrast. Multiplanar CT image reconstructions and MIPs were obtained to evaluate the vascular anatomy. Carotid stenosis measurements (when applicable) are obtained utilizing NASCET criteria, using the distal internal carotid diameter as the denominator. CONTRAST:  186mL ISOVUE-370 IOPAMIDOL (ISOVUE-370) INJECTION 76% COMPARISON:  Head CT  06/30/2018 FINDINGS: CTA NECK FINDINGS AORTIC ARCH: There is no calcific atherosclerosis of the aortic arch. There is no aneurysm, dissection or hemodynamically significant stenosis of the visualized ascending aorta and aortic arch. Conventional 3 vessel aortic branching pattern. The visualized proximal subclavian arteries are widely patent. RIGHT CAROTID SYSTEM: --Common carotid artery: Widely patent origin without common carotid artery dissection or aneurysm. --Internal carotid artery: No dissection, occlusion or aneurysm. Mild atherosclerotic calcification at the carotid bifurcation without hemodynamically significant stenosis. --External carotid artery: No acute abnormality. LEFT CAROTID SYSTEM: --Common carotid artery: Widely patent origin without common carotid artery dissection or aneurysm. --Internal carotid artery:No dissection, occlusion or aneurysm. Mild atherosclerotic calcification at the carotid bifurcation without hemodynamically significant stenosis. --External carotid artery: No acute abnormality. VERTEBRAL ARTERIES: Codominant configuration. Both origins are normal. No dissection, occlusion or flow-limiting stenosis to the vertebrobasilar confluence. SKELETON: There is no bony spinal canal stenosis. No lytic or blastic lesion. OTHER NECK: Normal pharynx, larynx and major salivary glands. No cervical lymphadenopathy. Unremarkable thyroid gland. UPPER CHEST: No pneumothorax or pleural effusion. No nodules or masses. CTA HEAD FINDINGS ANTERIOR CIRCULATION: --Intracranial internal carotid arteries: Normal. --Anterior cerebral arteries: Normal. Both A1 segments are present. Patent anterior communicating artery. --Middle cerebral arteries: Normal. --Posterior communicating arteries: Present bilaterally. POSTERIOR CIRCULATION: --Basilar artery: Normal. --Posterior cerebral arteries: Normal. --Superior cerebellar arteries: Normal. --Inferior cerebellar arteries: Normal anterior and posterior inferior  cerebellar arteries. VENOUS SINUSES: As permitted by contrast timing, patent. ANATOMIC VARIANTS: None DELAYED PHASE: No parenchymal contrast enhancement. Left occipital lobe temporal encephalomalacia redemonstrated. Review of the MIP images confirms the above findings. IMPRESSION: 1. No emergent large vessel occlusion or hemodynamically significant stenosis of the head or neck. 2. Mild bilateral carotid bifurcation calcific atherosclerosis without hemodynamically significant stenosis. Electronically Signed   By: Ulyses Jarred M.D.   On: 06/30/2018 20:40   Ct Head Code Stroke Wo Contrast  Result Date: 06/30/2018 CLINICAL DATA:  Code stroke. LEFT-sided weakness, RIGHT gaze. History of encephalitis. EXAM: CT HEAD WITHOUT CONTRAST TECHNIQUE: Contiguous axial images were obtained from the base of the skull through the vertex without intravenous contrast. COMPARISON:  None. FINDINGS: BRAIN: No intraparenchymal hemorrhage, mass effect or acute large vascular territory infarct. Confluent LEFT temporal occipital encephalomalacia lesion. Small area RIGHT temporal lobe encephalomalacia. 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 Mild global parenchymal brain volume loss. No abnormal extra-axial fluid collections. VASCULAR: Mild calcific atherosclerosis of the carotid siphons. No dense intracranial vessels. SKULL: No skull fracture. No significant scalp soft tissue swelling. SINUSES/ORBITS: Trace paranasal sinus mucosal thickening. Mastoid air cells are well aerated.The included ocular globes and orbital contents are non-suspicious. OTHER: None. ASPECTS [facility] Stroke Program Early CT Score) - Ganglionic level infarction (caudate, lentiform nuclei, internal capsule, insula, M1-M3 cortex): 7 - Supraganglionic infarction (M4-M6 cortex): 3 Total score (0-10 with 10 being normal): 10 IMPRESSION: 1. No acute intracranial process. 2. ASPECTS is 10. 3. Confluent LEFT temporal occipital and small  area RIGHT  temporal lobe encephalomalacia compatible with history of encephalomyelitis. 4. Critical Value/emergent results were called by telephone at the time of interpretation on 06/30/2018 at 7:57 pm to Dr. Gerlene Fee , who verbally acknowledged these results. Electronically Signed   By: Elon Alas M.D.   On: 06/30/2018 19:58        Scheduled Meds: . calcium-vitamin D  1 tablet Oral BID  . lacosamide  100 mg Oral BID  . levETIRAcetam  1,000 mg Oral Daily  . Vilazodone HCl  20 mg Oral Daily   Continuous Infusions: . sodium chloride 100 mL/hr at 07/01/18 0300  . acyclovir 600 mg (07/01/18 1432)  . ampicillin (OMNIPEN) IV    . cefTRIAXone (ROCEPHIN)  IV 2 g (07/01/18 1411)  . [START ON 07/02/2018] vancomycin       LOS: 1 day    Time spent: 35 minutes    Edwin Dada, MD [facility] 07/01/2018, 2:46 PM     Pager [phone] --- please page though AMION:  [email or web address] If 7PM-7AM, please contact night-coverage

## 2018-07-01 NOTE — ED Notes (Signed)
ED TO INPATIENT HANDOFF REPORT  Name/Age/Gender Mallory Boone 72 y.o. female  Code Status   Home/SNF/Other Home  Chief Complaint Seizure  Level of Care/Admitting Diagnosis ED Disposition    ED Disposition Condition [facility] [place] [100100]  Level of Care: Stepdown [14]  Diagnosis: Encephalopathy acute [161096]  Admitting Physician: Elwyn Reach [[id number]]  Attending Physician: Elwyn Reach [[id number]]  Estimated length of stay: past midnight tomorrow  Certification:: I certify this patient will need inpatient services for at least 2 midnights  PT Class (Do Not Modify): Inpatient [101]  PT Acc Code (Do Not Modify): Private [1]       Medical History Past Medical History:  Diagnosis Date  . Depression   . Encephalitis   . Osteoporosis   . Seizures ([place])     Allergies No Known Allergies  IV Location/Drains/Wounds Patient Lines/Drains/Airways Status   Active Line/Drains/Airways    Name:   Placement date:   Placement time:   Site:   Days:   Peripheral IV 06/30/18 Left Hand   06/30/18    2338    Hand   1          Labs/Imaging Results for orders placed or performed during the hospital encounter of 06/30/18 (from the past 48 hour(s))  Ethanol     Status: None   Collection Time: 06/30/18  7:58 PM  Result Value Ref Range   Alcohol, Ethyl (B) <10 <10 mg/dL    Comment: (NOTE) Lowest detectable limit for serum alcohol is 10 mg/dL. For medical purposes only. Performed at [facility], [place] [address] [place], [place] [address]   Protime-INR     Status: None   Collection Time: 06/30/18  7:58 PM  Result Value Ref Range   Prothrombin Time 12.4 11.4 - 15.2 seconds   INR 0.93     Comment: Performed at [facility], [place] [address], [place], [place] [address]  APTT     Status: None   Collection Time: 06/30/18  7:58 PM  Result Value Ref Range   aPTT 28 24 - 36 seconds    Comment:  Performed at [facility], [place] [address], [place], [place] [address]  CBC     Status: Abnormal   Collection Time: 06/30/18  7:58 PM  Result Value Ref Range   WBC 13.0 (H) 4.0 - 10.5 K/uL   RBC 4.68 3.87 - 5.11 MIL/uL   Hemoglobin 14.1 12.0 - 15.0 g/dL   HCT 43.0 36.0 - 46.0 %   MCV 91.9 78.0 - 100.0 fL   MCH 30.1 26.0 - 34.0 pg   MCHC 32.8 30.0 - 36.0 g/dL   RDW 13.7 11.5 - 15.5 %   Platelets 406 (H) 150 - 400 K/uL    Comment: Performed at [facility], [place] [address], [place], [place] [address]  Differential     Status: Abnormal   Collection Time: 06/30/18  7:58 PM  Result Value Ref Range   Neutrophils Relative % 73 %   Neutro Abs 9.5 (H) 1.7 - 7.7 K/uL   Lymphocytes Relative 21 %   Lymphs Abs 2.8 0.7 - 4.0 K/uL   Monocytes Relative 5 %   Monocytes Absolute 0.7 0.1 - 1.0 K/uL   Eosinophils Relative 0 %   Eosinophils Absolute 0.0 0.0 - 0.7 K/uL   Basophils Relative 1 %   Basophils Absolute 0.1 0.0 - 0.1 K/uL  Comment: Performed at [facility], [place] [address] [place], [place] [address]  Comprehensive metabolic panel     Status: Abnormal   Collection Time: 06/30/18  7:58 PM  Result Value Ref Range   Sodium 138 135 - 145 mmol/L   Potassium 4.9 3.5 - 5.1 mmol/L   Chloride 103 98 - 111 mmol/L   CO2 23 22 - 32 mmol/L   Glucose, Bld 165 (H) 70 - 99 mg/dL   BUN 15 8 - 23 mg/dL   Creatinine, Ser 0.78 0.44 - 1.00 mg/dL   Calcium 9.2 8.9 - 10.3 mg/dL   Total Protein 7.2 6.5 - 8.1 g/dL   Albumin 3.5 3.5 - 5.0 g/dL   AST 33 15 - 41 U/L   ALT 12 0 - 44 U/L   Alkaline Phosphatase 67 38 - 126 U/L   Total Bilirubin 1.2 0.3 - 1.2 mg/dL   GFR calc non Af Amer >60 >60 mL/min   GFR calc Af Amer >60 >60 mL/min    Comment: (NOTE) The eGFR has been calculated using the CKD EPI equation. This calculation has not been validated in all clinical situations. eGFR's persistently <60 mL/min signify possible Chronic Kidney Disease.     Anion gap 12 5 - 15    Comment: Performed at [facility], [place] [address] [place], [place] [address]  I-stat troponin, ED     Status: None   Collection Time: 06/30/18  8:02 PM  Result Value Ref Range   Troponin i, poc 0.01 0.00 - 0.08 ng/mL   Comment 3            Comment: Due to the release kinetics of cTnI, a negative result within the first hours of the onset of symptoms does not rule out myocardial infarction with certainty. If myocardial infarction is still suspected, repeat the test at appropriate intervals.   I-Stat Chem 8, ED     Status: Abnormal   Collection Time: 06/30/18  8:04 PM  Result Value Ref Range   Sodium 138 135 - 145 mmol/L   Potassium 4.9 3.5 - 5.1 mmol/L   Chloride 104 98 - 111 mmol/L   BUN 18 8 - 23 mg/dL   Creatinine, Ser 0.60 0.44 - 1.00 mg/dL   Glucose, Bld 172 (H) 70 - 99 mg/dL   Calcium, Ion 1.09 (L) 1.15 - 1.40 mmol/L   TCO2 27 22 - 32 mmol/L   Hemoglobin 12.6 12.0 - 15.0 g/dL   HCT 37.0 36.0 - 46.0 %  CBG monitoring, ED     Status: Abnormal   Collection Time: 06/30/18  8:18 PM  Result Value Ref Range   Glucose-Capillary 151 (H) 70 - 99 mg/dL  Urine rapid drug screen (hosp performed)     Status: None   Collection Time: 06/30/18  8:44 PM  Result Value Ref Range   Opiates NONE DETECTED NONE DETECTED   Cocaine NONE DETECTED NONE DETECTED   Benzodiazepines NONE DETECTED NONE DETECTED   Amphetamines NONE DETECTED NONE DETECTED   Tetrahydrocannabinol NONE DETECTED NONE DETECTED   Barbiturates NONE DETECTED NONE DETECTED    Comment: (NOTE) DRUG SCREEN FOR MEDICAL PURPOSES ONLY.  IF CONFIRMATION IS NEEDED FOR ANY PURPOSE, NOTIFY LAB WITHIN 5 DAYS. LOWEST DETECTABLE LIMITS FOR URINE DRUG SCREEN Drug Class                     Cutoff (ng/mL) Amphetamine and metabolites    1000 Barbiturate and metabolites  200 Benzodiazepine                 209 Tricyclics and metabolites     300 Opiates and metabolites        300 Cocaine and  metabolites        300 THC                            50 Performed at [facility], [place] [address] [place], [place] [address]   Urinalysis, Routine w reflex microscopic     Status: Abnormal   Collection Time: 06/30/18  8:44 PM  Result Value Ref Range   Color, Urine YELLOW YELLOW   APPearance CLEAR CLEAR   Specific Gravity, Urine 1.030 1.005 - 1.030   pH 9.0 (H) 5.0 - 8.0   Glucose, UA NEGATIVE NEGATIVE mg/dL   Hgb urine dipstick NEGATIVE NEGATIVE   Bilirubin Urine NEGATIVE NEGATIVE   Ketones, ur NEGATIVE NEGATIVE mg/dL   Protein, ur NEGATIVE NEGATIVE mg/dL   Nitrite NEGATIVE NEGATIVE   Leukocytes, UA NEGATIVE NEGATIVE    Comment: Performed at [place] [address], [place], [place] [address]   Ct Angio Head W Or Wo Contrast  Result Date: 06/30/2018 CLINICAL DATA:  Seizure EXAM: CT ANGIOGRAPHY HEAD AND NECK TECHNIQUE: Multidetector CT imaging of the head and neck was performed using the standard protocol during bolus administration of intravenous contrast. Multiplanar CT image reconstructions and MIPs were obtained to evaluate the vascular anatomy. Carotid stenosis measurements (when applicable) are obtained utilizing NASCET criteria, using the distal internal carotid diameter as the denominator. CONTRAST:  122m ISOVUE-370 IOPAMIDOL (ISOVUE-370) INJECTION 76% COMPARISON:  Head CT 06/30/2018 FINDINGS: CTA NECK FINDINGS AORTIC ARCH: There is no calcific atherosclerosis of the aortic arch. There is no aneurysm, dissection or hemodynamically significant stenosis of the visualized ascending aorta and aortic arch. Conventional 3 vessel aortic branching pattern. The visualized proximal subclavian arteries are widely patent. RIGHT CAROTID SYSTEM: --Common carotid artery: Widely patent origin without common carotid artery dissection or aneurysm. --Internal carotid artery: No dissection, occlusion or aneurysm. Mild atherosclerotic calcification at the  carotid bifurcation without hemodynamically significant stenosis. --External carotid artery: No acute abnormality. LEFT CAROTID SYSTEM: --Common carotid artery: Widely patent origin without common carotid artery dissection or aneurysm. --Internal carotid artery:No dissection, occlusion or aneurysm. Mild atherosclerotic calcification at the carotid bifurcation without hemodynamically significant stenosis. --External carotid artery: No acute abnormality. VERTEBRAL ARTERIES: Codominant configuration. Both origins are normal. No dissection, occlusion or flow-limiting stenosis to the vertebrobasilar confluence. SKELETON: There is no bony spinal canal stenosis. No lytic or blastic lesion. OTHER NECK: Normal pharynx, larynx and major salivary glands. No cervical lymphadenopathy. Unremarkable thyroid gland. UPPER CHEST: No pneumothorax or pleural effusion. No nodules or masses. CTA HEAD FINDINGS ANTERIOR CIRCULATION: --Intracranial internal carotid arteries: Normal. --Anterior cerebral arteries: Normal. Both A1 segments are present. Patent anterior communicating artery. --Middle cerebral arteries: Normal. --Posterior communicating arteries: Present bilaterally. POSTERIOR CIRCULATION: --Basilar artery: Normal. --Posterior cerebral arteries: Normal. --Superior cerebellar arteries: Normal. --Inferior cerebellar arteries: Normal anterior and posterior inferior cerebellar arteries. VENOUS SINUSES: As permitted by contrast timing, patent. ANATOMIC VARIANTS: None DELAYED PHASE: No parenchymal contrast enhancement. Left occipital lobe temporal encephalomalacia redemonstrated. Review of the MIP images confirms the above findings. IMPRESSION: 1. No emergent large vessel occlusion or hemodynamically significant stenosis of the head or neck. 2. Mild bilateral carotid bifurcation calcific atherosclerosis without hemodynamically significant stenosis. Electronically Signed  By: Ulyses Jarred M.D.   On: 06/30/2018 20:40   Dg Chest 2  View  Result Date: 06/30/2018 CLINICAL DATA:  Seizure at 1630 hours. EXAM: CHEST - 2 VIEW COMPARISON:  None. FINDINGS: Mild pulmonary vascular congestion without alveolar consolidation, effusion or pneumothorax. Heart size is top-normal. There is moderate aortic atherosclerosis. Surgical clips project over the AP window. No acute fracture nor joint dislocations are apparent. IMPRESSION: Aortic atherosclerosis. Pulmonary vascular congestion consistent with mild CHF. Electronically Signed   By: Ashley Royalty M.D.   On: 06/30/2018 22:27   Ct Angio Neck W And/or Wo Contrast  Result Date: 06/30/2018 CLINICAL DATA:  Seizure EXAM: CT ANGIOGRAPHY HEAD AND NECK TECHNIQUE: Multidetector CT imaging of the head and neck was performed using the standard protocol during bolus administration of intravenous contrast. Multiplanar CT image reconstructions and MIPs were obtained to evaluate the vascular anatomy. Carotid stenosis measurements (when applicable) are obtained utilizing NASCET criteria, using the distal internal carotid diameter as the denominator. CONTRAST:  142m ISOVUE-370 IOPAMIDOL (ISOVUE-370) INJECTION 76% COMPARISON:  Head CT 06/30/2018 FINDINGS: CTA NECK FINDINGS AORTIC ARCH: There is no calcific atherosclerosis of the aortic arch. There is no aneurysm, dissection or hemodynamically significant stenosis of the visualized ascending aorta and aortic arch. Conventional 3 vessel aortic branching pattern. The visualized proximal subclavian arteries are widely patent. RIGHT CAROTID SYSTEM: --Common carotid artery: Widely patent origin without common carotid artery dissection or aneurysm. --Internal carotid artery: No dissection, occlusion or aneurysm. Mild atherosclerotic calcification at the carotid bifurcation without hemodynamically significant stenosis. --External carotid artery: No acute abnormality. LEFT CAROTID SYSTEM: --Common carotid artery: Widely patent origin without common carotid artery dissection or  aneurysm. --Internal carotid artery:No dissection, occlusion or aneurysm. Mild atherosclerotic calcification at the carotid bifurcation without hemodynamically significant stenosis. --External carotid artery: No acute abnormality. VERTEBRAL ARTERIES: Codominant configuration. Both origins are normal. No dissection, occlusion or flow-limiting stenosis to the vertebrobasilar confluence. SKELETON: There is no bony spinal canal stenosis. No lytic or blastic lesion. OTHER NECK: Normal pharynx, larynx and major salivary glands. No cervical lymphadenopathy. Unremarkable thyroid gland. UPPER CHEST: No pneumothorax or pleural effusion. No nodules or masses. CTA HEAD FINDINGS ANTERIOR CIRCULATION: --Intracranial internal carotid arteries: Normal. --Anterior cerebral arteries: Normal. Both A1 segments are present. Patent anterior communicating artery. --Middle cerebral arteries: Normal. --Posterior communicating arteries: Present bilaterally. POSTERIOR CIRCULATION: --Basilar artery: Normal. --Posterior cerebral arteries: Normal. --Superior cerebellar arteries: Normal. --Inferior cerebellar arteries: Normal anterior and posterior inferior cerebellar arteries. VENOUS SINUSES: As permitted by contrast timing, patent. ANATOMIC VARIANTS: None DELAYED PHASE: No parenchymal contrast enhancement. Left occipital lobe temporal encephalomalacia redemonstrated. Review of the MIP images confirms the above findings. IMPRESSION: 1. No emergent large vessel occlusion or hemodynamically significant stenosis of the head or neck. 2. Mild bilateral carotid bifurcation calcific atherosclerosis without hemodynamically significant stenosis. Electronically Signed   By: KUlyses JarredM.D.   On: 06/30/2018 20:40   Ct Head Code Stroke Wo Contrast  Result Date: 06/30/2018 CLINICAL DATA:  Code stroke. LEFT-sided weakness, RIGHT gaze. History of encephalitis. EXAM: CT HEAD WITHOUT CONTRAST TECHNIQUE: Contiguous axial images were obtained from the base  of the skull through the vertex without intravenous contrast. COMPARISON:  None. FINDINGS: BRAIN: No intraparenchymal hemorrhage, mass effect or acute large vascular territory infarct. Confluent LEFT temporal occipital encephalomalacia lesion. Small area RIGHT temporal lobe encephalomalacia. 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 Mild global parenchymal brain volume loss. No abnormal extra-axial fluid collections. VASCULAR: Mild calcific atherosclerosis of  the carotid siphons. No dense intracranial vessels. SKULL: No skull fracture. No significant scalp soft tissue swelling. SINUSES/ORBITS: Trace paranasal sinus mucosal thickening. Mastoid air cells are well aerated.The included ocular globes and orbital contents are non-suspicious. OTHER: None. ASPECTS [facility] Stroke Program Early CT Score) - Ganglionic level infarction (caudate, lentiform nuclei, internal capsule, insula, M1-M3 cortex): 7 - Supraganglionic infarction (M4-M6 cortex): 3 Total score (0-10 with 10 being normal): 10 IMPRESSION: 1. No acute intracranial process. 2. ASPECTS is 10. 3. 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 4. Critical Value/emergent results were called by telephone at the time of interpretation on 06/30/2018 at 7:57 pm to Dr. Gerlene Fee , who verbally acknowledged these results. Electronically Signed   By: Elon Alas M.D.   On: 06/30/2018 19:58    Pending Labs Unresulted Labs (From admission, onward)    Start     Ordered   06/30/18 2217  Hsv Culture And Typing  [facility] ED ADULT CSF PANEL)  STAT,   STAT     06/30/18 2216   06/30/18 2216  CSF cell count with differential collection tube #: 1  [facility] ED ADULT CSF PANEL)  STAT,   STAT    Question:  collection tube #  Answer:  1   06/30/18 2216   06/30/18 2216  CSF cell count with differential collection tube #: 4  [facility] ED ADULT CSF PANEL)  STAT,    STAT    Question:  collection tube #  Answer:  4   06/30/18 2216   06/30/18 2216  CSF culture  [facility] ED ADULT CSF PANEL)  STAT,   STAT    Question:  Are there also cytology or pathology orders on this specimen?  Answer:  No   06/30/18 2216   06/30/18 2216  Gram stain  [facility] ED ADULT CSF PANEL)  STAT,   STAT     06/30/18 2216   06/30/18 2216  Glucose, CSF  [facility] ED ADULT CSF PANEL)  STAT,   STAT     06/30/18 2216   06/30/18 2216  Protein, CSF  ([facility] ED ADULT CSF PANEL)  STAT,   STAT     06/30/18 2216   06/30/18 2216  Herpes simplex virus (HSV), DNA by PCR Cerebrospinal Fluid  [facility] ED ADULT CSF PANEL)  STAT,   STAT     06/30/18 2216   06/30/18 2048  Blood culture (routine x 2)  BLOOD CULTURE X 2,   STAT     06/30/18 2047   Signed and Held  CBC  Tomorrow morning,   R     Signed and Held   Signed and Held  Comprehensive metabolic panel  Tomorrow morning,   R     Signed and Held   Signed and Held  TSH  Once,   R     Signed and Held          Vitals/Pain Today's Vitals   07/01/18 0034 07/01/18 0039 07/01/18 0039 07/01/18 0100  BP: 115/70   (!) 109/58  Pulse: 86   95  Resp: 19   17  Temp:  98.2 F (36.8 C)    TempSrc:  Oral    SpO2: 94%   93%  Weight:      Height:      PainSc:   0-No pain     Isolation Precautions No active isolations  Medications Medications  iopamidol (ISOVUE-370) 76 % injection (has no administration  in time range)  acyclovir (ZOVIRAX) 945 mg in dextrose 5 % 150 mL IVPB (945 mg Intravenous New Bag/Given 07/01/18 0034)  midazolam (VERSED) injection 4 mg (0.5 mg Intravenous Given 06/30/18 2324)  vancomycin (VANCOCIN) 2,000 mg in sodium chloride 0.9 % 500 mL IVPB (has no administration in time range)  iopamidol (ISOVUE-370) 76 % injection 100 mL (100 mLs Intravenous Contrast Given 06/30/18 1956)  LORazepam (ATIVAN) injection 1 mg (1 mg Intravenous Given 06/30/18 2122)  sodium chloride 0.9 % bolus 500 mL (0 mLs Intravenous Stopped 06/30/18 2237)  phenytoin  (DILANTIN) 1,000 mg in sodium chloride 0.9 % 250 mL IVPB ( Intravenous Stopped 06/30/18 2234)  ondansetron (ZOFRAN) injection 4 mg (4 mg Intravenous Given 06/30/18 2122)  cefTRIAXone (ROCEPHIN) 2 g in sodium chloride 0.9 % 100 mL IVPB ( Intravenous Stopped 07/01/18 0029)  acetaminophen (TYLENOL) tablet 650 mg (650 mg Oral Given 06/30/18 2239)  LORazepam (ATIVAN) injection 1 mg (1 mg Intravenous Given 06/30/18 2221)  haloperidol lactate (HALDOL) injection 2 mg (2 mg Intravenous Given 06/30/18 2240)    Mobility walks

## 2018-07-01 NOTE — Progress Notes (Signed)
Pharmacy Antibiotic Note  Mallory Boone is a 72 y.o. female admitted on 06/30/2018 with meningitis.  Pharmacy has been consulted for Vancomycin dosing.  Plan: Vancomycin 2gm iv x1, then 1750mg  iv q24hr  Goal AUC = 400 - 500 for all indications, except meningitis (goal AUC > 500 and Cmin 15-20 mcg/mL)   Height: 5\' 6"  (167.6 cm) Weight: 217 lb 9.5 oz (98.7 kg) IBW/kg (Calculated) : 59.3  Temp (24hrs), Avg:100.2 F (37.9 C), Min:98.2 F (36.8 C), Max:101.7 F (38.7 C)  Recent Labs  Lab 06/30/18 1958 06/30/18 2004  WBC 13.0*  --   CREATININE 0.78 0.60    Estimated Creatinine Clearance: 74.3 mL/min (by C-G formula based on SCr of 0.6 mg/dL).    No Known Allergies  Antimicrobials this admission: Vancomycin 07/01/2018 >> Ceftriaxone 06/30/2018 >>  Acyclovir 06/30/2018 x1 ?  Dose adjustments this admission: -  Microbiology results: -  Thank you for allowing pharmacy to be a part of this patient's care.  Nani Skillern Crowford 07/01/2018 3:27 AM

## 2018-07-01 NOTE — Procedures (Signed)
CLINICAL DATA: Altered mental status, possible CNS infection.  Seizures.  EXAM:  DIAGNOSTIC LUMBAR PUNCTURE UNDER FLUOROSCOPIC GUIDANCE  FLUOROSCOPY TIME: Fluoroscopy Time:  0 minutes, 48 seconds  Radiation Exposure Index (if provided by the fluoroscopic device):  8.4 mGy  Number of Acquired Spot Images: 0  PROCEDURE:  I discussed the risks (including hemorrhage, infection, headache, and nerve damage, among others), benefits, and alternatives to fluoroscopically guided lumbar puncture with the patient's husband, who is consenting on behalf of the patient.  We specifically discussed the high technical likelihood of success of the procedure.  The patient's husband understood and elected for the patient to undergo the procedure.    Standard time-out was employed.  Following sterile skin prep and local anesthetic administration consisting of 1 percent lidocaine, a 22 gauge spinal needle was advanced without difficulty into the thecal sac at the at the L3-4 level.  Clear CSF was returned.  Opening pressure was not obtained.   12 cc of clear CSF was collected.  The needle was subsequently removed and the skin cleansed and bandaged.  No immediate complications were observed.   IMPRESSION:  Successful fluoroscopically guided lumbar puncture yielding 12 cc of clear CSF which was sent to the lab for the requested tests.

## 2018-07-01 NOTE — Procedures (Signed)
ELECTROENCEPHALOGRAM REPORT   Patient: Mallory Boone       Room #: 3J82N EEG No. ID: [id number] Age: 72 y.o.        Sex: female Referring Physician: Danford Report Date:  07/01/2018        Interpreting Physician: Alexis Goodell  History: [name] is an 72 y.o. female with seizures  Medications:  Acyclovir, Omnipen, Rocephin, Vimpat, Keppra, K-dur, Vancomycin, Vilazodone  Conditions of Recording:  This is a 21 channel routine scalp EEG performed with bipolar and monopolar montages arranged in accordance to the international 10/20 system of electrode placement. One channel was dedicated to EKG recording.  The patient is in the awake, drowsy and asleep states.  Description:  The patient was alerted to a waking background activity consisting of a low voltage, symmetrical, fairly well organized, 8 Hz alpha activity, seen from the parieto-occipital and posterior temporal regions.  Low voltage fast activity, poorly organized, is seen anteriorly and is at times superimposed on more posterior regions.  A mixture of theta and alpha rhythms are seen from the central and temporal regions.  Superimposed rhythmical beta activity noted as well. The patient drowses for the majority of the recording with slowing to irregular, low voltage theta and beta activity.   The patient goes in to a light sleep with symmetrical sleep spindles, vertex central sharp transients and irregular slow activity.   During drowse and sleep the patient is noted to have infrequent, intermittent right frontal sharp activity with phase reversal at F8.  Hyperventilation and intermittent photic stimulation were not performed.   IMPRESSION: This is an abnormal electroencephalogram secondary to intermittent sharp transients, most notably during drowse and sleep, noted in the right frontal region with phase reversal at F8.     Alexis Goodell, MD Neurology [phone] 07/01/2018, 3:44 PM

## 2018-07-02 ENCOUNTER — Inpatient Hospital Stay (HOSPITAL_COMMUNITY): Payer: Medicare Other

## 2018-07-02 DIAGNOSIS — R21 Rash and other nonspecific skin eruption: Secondary | ICD-10-CM

## 2018-07-02 LAB — BASIC METABOLIC PANEL
ANION GAP: 10 (ref 5–15)
BUN: 9 mg/dL (ref 8–23)
CO2: 25 mmol/L (ref 22–32)
Calcium: 8.3 mg/dL — ABNORMAL LOW (ref 8.9–10.3)
Chloride: 106 mmol/L (ref 98–111)
Creatinine, Ser: 0.68 mg/dL (ref 0.44–1.00)
GFR calc Af Amer: >60 mL/min (ref >60)
GFR calc non Af Amer: >60 mL/min (ref >60)
GLUCOSE: 92 mg/dL (ref 70–99)
POTASSIUM: 3.9 mmol/L (ref 3.5–5.1)
Sodium: 141 mmol/L (ref 135–145)

## 2018-07-02 LAB — CSF CELL COUNT WITH DIFFERENTIAL
RBC Count, CSF: 0 /mm3
Tube #: 4
WBC CSF: 2 /mm3 (ref 0–5)

## 2018-07-02 LAB — HERPES SIMPLEX VIRUS(HSV) DNA BY PCR
HSV 1 DNA: NEGATIVE
HSV 2 DNA: NEGATIVE

## 2018-07-02 LAB — CBC
HEMATOCRIT: 40.9 % (ref 36.0–46.0)
HEMOGLOBIN: 13.1 g/dL (ref 12.0–15.0)
MCH: 30.2 pg (ref 26.0–34.0)
MCHC: 32 g/dL (ref 30.0–36.0)
MCV: 94.2 fL (ref 78.0–100.0)
Platelets: 292 10*3/uL (ref 150–400)
RBC: 4.34 MIL/uL (ref 3.87–5.11)
RDW: 13.4 % (ref 11.5–15.5)
WBC: 13.2 10*3/uL — ABNORMAL HIGH (ref 4.0–10.5)

## 2018-07-02 MED ORDER — POLYETHYLENE GLYCOL 3350 17 G PO PACK: 17.0000 g | PACK | Freq: Every day | ORAL | Status: DC | PRN

## 2018-07-02 MED ORDER — GADOBUTROL 1 MMOL/ML IV SOLN
10.0000 mL | Freq: Once | INTRAVENOUS | Status: AC | PRN
  Administered 2018-07-02: 10 mL via INTRAVENOUS

## 2018-07-02 MED ORDER — LORAZEPAM 1 MG PO TABS
1.0000 mg | ORAL_TABLET | Freq: Once | ORAL | Status: AC | PRN
  Administered 2018-07-02: 1 mg via ORAL
  Filled 2018-07-02: qty 1

## 2018-07-02 MED ORDER — LEVETIRACETAM ER 500 MG PO TB24
2000.0000 mg | ORAL_TABLET | Freq: Every day | ORAL | Status: DC
  Administered 2018-07-02: 2000 mg via ORAL
  Filled 2018-07-02: qty 4

## 2018-07-02 MED ORDER — HYDROXYZINE HCL 50 MG/ML IM SOLN
25.0000 mg | Freq: Once | INTRAMUSCULAR | Status: AC
  Administered 2018-07-02: 25 mg via INTRAMUSCULAR
  Filled 2018-07-02: qty 1

## 2018-07-02 NOTE — Evaluation (Signed)
Physical Therapy Evaluation Patient Details Name: Mallory Boone MRN: [id number] DOB: 1946/10/04 Today's Date: 07/02/2018   History of Present Illness  Pt is a 72 y/o female admitted secondary to increased seizure activity. 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 Pt is s/p lumbar puncture on 9/24 and awaiting results. PMH includes dementia and encephalitis with resultant seizures.   Clinical Impression  Pt admitted secondary to problem above with deficits below. Pt presenting with cognitive deficits, decreased safety awareness, weakness, and decreased balance. Requiring min to min guard A for mobility using RW, and noted 1 LOB during ambulation when reaching outside of BOS for door handle. Pt's husband reports he is able to assist pt as needed and is wanting to take pt home at d/c. Will continue to follow acutely to maximize functional mobility independence and safety.     Follow Up Recommendations Home health PT;Supervision/Assistance - 24 hour    Equipment Recommendations  Rolling walker with 5" wheels    Recommendations for Other Services OT consult     Precautions / Restrictions Precautions Precautions: Fall;Other (comment) Precaution Comments: seizures  Restrictions Weight Bearing Restrictions: No      Mobility  Bed Mobility Overal bed mobility: Needs Assistance Bed Mobility: Supine to Sit;Sit to Supine     Supine to sit: Min assist Sit to supine: Supervision   General bed mobility comments: Min A for trunk elevation to come to sitting. Required supervision for return to supine.   Transfers Overall transfer level: Needs assistance Equipment used: Rolling walker (2 wheeled) Transfers: Sit to/from Stand Sit to Stand: Min guard         General transfer comment: Min guard A for safety. Pt pulling up on RW despite cues for safe hand placement.    Ambulation/Gait Ambulation/Gait assistance: Min guard;Min assist Gait Distance (Feet): 50 Feet Assistive device: Rolling walker (2 wheeled) Gait Pattern/deviations: Step-through pattern;Decreased stride length;Trunk flexed Gait velocity: Decreased    General Gait Details: Slow, slightly unsteady gait with use of RW. One LOB noted when reaching outside of RW for doorhandle, requiring min A for steadying. Required cues for proximity to device as well. Pt's husband reports gait is somewhat close to baseline.   Stairs            Wheelchair Mobility    Modified Rankin (Stroke Patients Only) Modified Rankin (Stroke Patients Only) Pre-Morbid Rankin Score: Moderate disability Modified Rankin: Moderately severe disability     Balance Overall balance assessment: Needs assistance Sitting-balance support: No upper extremity supported;Feet supported Sitting balance-Leahy Scale: Good     Standing balance support: Bilateral upper extremity supported;During functional activity Standing balance-Leahy Scale: Poor Standing balance comment: Reliant on BUE support                              Pertinent Vitals/Pain Pain Assessment: No/denies pain    Home Living Family/patient expects to be discharged to:: Private residence Living Arrangements: Spouse/significant other Available Help at Discharge: Family;Available 24 hours/day Type of Home: House Home Access: Stairs to enter Entrance Stairs-Rails: Right Entrance Stairs-Number of Steps: 3 Home Layout: Two level;Able to live on main level with bedroom/bathroom Home Equipment: Crutches      Prior Function Level of Independence: Independent               Hand Dominance        Extremity/Trunk Assessment  Upper Extremity Assessment Upper Extremity Assessment: Defer to OT evaluation    Lower Extremity Assessment Lower Extremity Assessment: Generalized weakness;RLE deficits/detail RLE Deficits / Details: Pt's  husband reports "stiffness" in R knee and ankle at baseline.     Cervical / Trunk Assessment Cervical / Trunk Assessment: Normal  Communication   Communication: No difficulties  Cognition Arousal/Alertness: Awake/alert Behavior During Therapy: WFL for tasks assessed/performed Overall Cognitive Status: Impaired/Different from baseline Area of Impairment: Orientation;Memory;Following commands;Safety/judgement;Problem solving                 Orientation Level: Disoriented to;Place;Time;Situation   Memory: Decreased short-term memory;Decreased recall of precautions Following Commands: Follows one step commands with increased time Safety/Judgement: Decreased awareness of deficits;Decreased awareness of safety   Problem Solving: Slow processing;Requires verbal cues General Comments: Pt only oriented to person, however, was able to identify her husband. Pt required safety cues to wait for PT as she was slightly impulsive at times. Responded well to safety cues, however,       General Comments General comments (skin integrity, edema, etc.): Pt's husband present throughout session. Pt's husband reports plan is to return home at d/c and he will be able to assist.     Exercises     Assessment/Plan    PT Assessment Patient needs continued PT services  PT Problem List Decreased strength;Decreased balance;Decreased mobility;Decreased cognition;Decreased knowledge of use of DME;Decreased safety awareness;Decreased knowledge of precautions       PT Treatment Interventions DME instruction;Gait training;Functional mobility training;Stair training;Therapeutic activities;Therapeutic exercise;Balance training;Cognitive remediation;Patient/family education    PT Goals (Current goals can be found in the Care Plan section)  Acute Rehab PT Goals Patient Stated Goal: to go home at d/c  PT Goal Formulation: With patient/family Time For Goal Achievement: 07/16/18 Potential to Achieve Goals:  Fair    Frequency Min 3X/week   Barriers to discharge        Co-evaluation               AM-PAC PT "6 Clicks" Daily Activity  Outcome Measure Difficulty turning over in bed (including adjusting bedclothes, sheets and blankets)?: A Little Difficulty moving from lying on back to sitting on the side of the bed? : Unable Difficulty sitting down on and standing up from a chair with arms (e.g., wheelchair, bedside commode, etc,.)?: Unable Help needed moving to and from a bed to chair (including a wheelchair)?: A Little Help needed walking in hospital room?: A Little Help needed climbing 3-5 steps with a railing? : A Lot 6 Click Score: 13    End of Session Equipment Utilized During Treatment: Gait belt Activity Tolerance: Patient tolerated treatment well Patient left: in bed;with call bell/phone within reach;with bed alarm set;with family/visitor present Nurse Communication: Mobility status PT Visit Diagnosis: Unsteadiness on feet (R26.81);Muscle weakness (generalized) (M62.81)    Time: 9233-0076 PT Time Calculation (min) (ACUTE ONLY): 23 min   Charges:   PT Evaluation $PT Eval Moderate Complexity: 1 Mod PT Treatments $Gait Training: 8-22 mins        Mallory Boone, PT, DPT  Acute Rehabilitation Services  Pager: [phone] Office: [phone]   Mallory Boone 07/02/2018, 3:30 PM

## 2018-07-02 NOTE — Progress Notes (Signed)
Pt off unit to MRI. Delia Heady RN

## 2018-07-02 NOTE — Progress Notes (Signed)
PROGRESS NOTE    Mallory Boone  IRJ:[id number] DOB: 23-Nov-1945 DOA: 06/30/2018 PCP: Patient, No Pcp Per      Brief Narrative:  Mrs. Siers is a 72 y.o. 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  Patient had been in normal health until day of admission, family noticed that she was having multiple of her typical partial seizures, progressively more frequent until evening when she had a generalized tonic-clonic seizure and was brought tot he ER.  There was some initial concern for stroke, but CT head and CTA head and neck were normal and it was determined to be Todd paralysis.  She was loaded with Dilantin.  Subsequently noted to have fever to 101.37F, LP failed in ER, started empirically on acyclovir, ceftriaxone and Vancomycin.        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  Gram stain negative.  RBCs 21 in tube 1.  Enterovirus and HSV pending.  MR pending.  Lack of WBC in CSF rules out bacterial meningitis.  HSV and enterovirus still in differential. -Stop vancomycin, ampicillin and ceftriaxone -Continue acyclovir -Follow up MRI brain -Follow CSF culture   Enterovirus PCR  HSV PCR    Seizures No seizures overnight -Continue Vimpat and Keppra -Consult Nephrology, appreciate cares  Dyspnea Patient complained to nursing of "chest tightness" and "out of breath".  Denies to me. ECG personally reviewed, normal.  CXR personally reviewed, no new opacities.  No O2 requirement, no tahcypnea.  Rash She has a red maculopapular rash on the neck, face, upper chest, abdomen, and exteding slightly out to arms.  This temporally followed the vancomycin most closely.  Other likely culprit is ampicillin.  Patient has no known allergies.  No symptoms of anaphylaxis.  I doubt this was from acyclovir, will  monitor closely. -Stop ampicilin and vancomycin -Defer diphenhydramine  -Observe closely  Other medcaitions -Continue viladzodone  Hypokalemia Resolved       DVT prophylaxis: SCDs Code Status: FULL Family Communication: Husband MDM and disposition Plan: The below labs and imaging reports were reviewed and summarized above.  Medication management as above.  The patient was admitted with suspected HSV recurrence, fever with new seizures, headache.  Her LP has ruled out bacterial meningitis, although awaiting HSV PCR and MRI to rule out HSV encephalitis.  Clinically she is improving, although further inaptient services are reasonable and expected given the severity of presentation and potential risk of her differential diagnosis.       Consultants:   Neurology  Procedures:   LP 9/24   CT head and CTA head and neck  Antimicrobials:   Vancomycin 9/23 >> 9/25  Ceftriaxone 9/23 >> 9/25  Acyclovir 9./23 >  Ampicillin 9/24 >> 9/25   Subjective: To me she denies chest discomfort, chest tightness, dyspnea.  She endorses generalized malaise, "feeling something is wrong".  She has no vision changes, headache, neck pain, cough, urinary symptoms.  No fever.  No new seizures.    Objective: Vitals:   07/01/18 2055 07/02/18 0016 07/02/18 0345 07/02/18 0738  BP: 128/66 119/65 124/67 (!) 111/56  Pulse:  73  75  Resp:   18 18  Temp:  97.8 F (36.6 C)  98.4 F (36.9 C)  TempSrc:    Oral  SpO2: 94%  94% 94%  Weight:      Height:  Intake/Output Summary (Last 24 hours) at 07/02/2018 0912 Last data filed at 07/02/2018 0700 Gross per 24 hour  Intake 4782.53 ml  Output 700 ml  Net 4082.53 ml   Filed Weights   06/30/18 1946 06/30/18 2027 07/01/18 0224  Weight: 98 kg 94.4 kg 98.7 kg    Examination: General appearance: Overweight adult female, alert and responsive, lying in bed appears very tired, HEENT: Anicteric, conjunctival pink, lids and lashes normal.  No  nasal deformity, discharge, or epistaxis.  Lips, and teeth are normal.  Oropharynx tacky dry without oral lesions.  No lesions on the lips, face, nose. Skin: Skin is warm and dry, no jaundice, there is a diffuse red maculopapular rash on the edges of the face, upper chest, arms, and upper legs, symmetrically.  No HSV appearing lesions on the lips, face, head. Cardiac: Ocular rate and rhythm, no murmurs appreciated, JVP not visible, no lower extremity edema. Respiratory: Lungs clear to auscultation, normal respiratory rate and rhythm, Abdomen: Abdomen soft without tenderness to palpation, distention, or hepatospleno megaly MSK: No deformities or effusions. Neuro: Awake and responsive to questions.  Extraocular movements intact, cranial nerves II through XII intact, moves all extremities with equal strength and normal coordination.  Speech fluent.    Psych: Sensorium intact and responding to questions, attention normal, affect blunted, judgment and insight appear impaired.    Data Reviewed: I have personally reviewed following labs and imaging studies:  CBC: Recent Labs  Lab 06/30/18 1958 06/30/18 2004 07/01/18 0607 07/02/18 0430  WBC 13.0*  --  16.7* 13.2*  NEUTROABS 9.5*  --   --   --   HGB 14.1 12.6 12.4 13.1  HCT 43.0 37.0 39.1 40.9  MCV 91.9  --  92.7 94.2  PLT 406*  --  307 314   Basic Metabolic Panel: Recent Labs  Lab 06/30/18 1958 06/30/18 2004 07/01/18 0607 07/02/18 0430  NA 138 138 138 141  K 4.9 4.9 3.2* 3.9  CL 103 104 105 106  CO2 23  --  24 25  GLUCOSE 165* 172* 94 92  BUN 15 18 12 9   CREATININE 0.78 0.60 0.69 0.68  CALCIUM 9.2  --  8.0* 8.3*   GFR: Estimated Creatinine Clearance: 74.3 mL/min (by C-G formula based on SCr of 0.68 mg/dL). Liver Function Tests: Recent Labs  Lab 06/30/18 1958 07/01/18 0607  AST 33 17  ALT 12 13  ALKPHOS 67 60  BILITOT 1.2 0.7  PROT 7.2 6.0*  ALBUMIN 3.5 3.0*   No results for input(s): LIPASE, AMYLASE in the last 168  hours. No results for input(s): AMMONIA in the last 168 hours. Coagulation Profile: Recent Labs  Lab 06/30/18 1958  INR 0.93   Cardiac Enzymes: No results for input(s): CKTOTAL, CKMB, CKMBINDEX, TROPONINI in the last 168 hours. BNP (last 3 results) No results for input(s): PROBNP in the last 8760 hours. HbA1C: No results for input(s): HGBA1C in the last 72 hours. CBG: Recent Labs  Lab 06/30/18 2018  GLUCAP 151*   Lipid Profile: No results for input(s): CHOL, HDL, LDLCALC, TRIG, CHOLHDL, LDLDIRECT in the last 72 hours. Thyroid Function Tests: Recent Labs    07/01/18 0607  TSH 0.377   Anemia Panel: No results for input(s): VITAMINB12, FOLATE, FERRITIN, TIBC, IRON, RETICCTPCT in the last 72 hours. Urine analysis:    Component Value Date/Time   COLORURINE YELLOW 06/30/2018 2044   APPEARANCEUR CLEAR 06/30/2018 2044   LABSPEC 1.030 06/30/2018 2044   PHURINE 9.0 (H) 06/30/2018 2044  GLUCOSEU NEGATIVE 06/30/2018 2044   HGBUR NEGATIVE 06/30/2018 2044   BILIRUBINUR NEGATIVE 06/30/2018 2044   KETONESUR NEGATIVE 06/30/2018 2044   PROTEINUR NEGATIVE 06/30/2018 2044   NITRITE NEGATIVE 06/30/2018 2044   LEUKOCYTESUR NEGATIVE 06/30/2018 2044   Sepsis Labs: @LABRCNTIP (procalcitonin:4,lacticacidven:4)  ) Recent Results (from the past 240 hour(s))  Blood culture (routine x 2)     Status: None (Preliminary result)   Collection Time: 06/30/18  9:03 PM  Result Value Ref Range Status   Specimen Description   Final    BLOOD LEFT HAND Performed at [facility], [place] [address], [place], [place] [address]    Special Requests   Final    BOTTLES DRAWN AEROBIC AND ANAEROBIC Blood Culture adequate volume Performed at [facility] [address] [place], [place] [address]    Culture   Final    NO GROWTH < 24 HOURS Performed at [facility] [address], [place],  [address]    Report Status PENDING  Incomplete  CSF culture      Status: None (Preliminary result)   Collection Time: 07/01/18  4:37 PM  Result Value Ref Range Status   Specimen Description CSF  Final   Special Requests NONE  Final   Gram Stain   Final    WBC PRESENT,BOTH PMN AND MONONUCLEAR NO ORGANISMS SEEN CYTOSPIN SMEAR    Culture   Final    NO GROWTH < 24 HOURS Performed at [facility], [place] [address] [place],  [address]    Report Status PENDING  Incomplete         Radiology Studies: Ct Angio Head W Or Wo Contrast  Result Date: 06/30/2018 CLINICAL DATA:  Seizure EXAM: CT ANGIOGRAPHY HEAD AND NECK TECHNIQUE: Multidetector CT imaging of the head and neck was performed using the standard protocol during bolus administration of intravenous contrast. Multiplanar CT image reconstructions and MIPs were obtained to evaluate the vascular anatomy. Carotid stenosis measurements (when applicable) are obtained utilizing NASCET criteria, using the distal internal carotid diameter as the denominator. CONTRAST:  165mL ISOVUE-370 IOPAMIDOL (ISOVUE-370) INJECTION 76% COMPARISON:  Head CT 06/30/2018 FINDINGS: CTA NECK FINDINGS AORTIC ARCH: There is no calcific atherosclerosis of the aortic arch. There is no aneurysm, dissection or hemodynamically significant stenosis of the visualized ascending aorta and aortic arch. Conventional 3 vessel aortic branching pattern. The visualized proximal subclavian arteries are widely patent. RIGHT CAROTID SYSTEM: --Common carotid artery: Widely patent origin without common carotid artery dissection or aneurysm. --Internal carotid artery: No dissection, occlusion or aneurysm. Mild atherosclerotic calcification at the carotid bifurcation without hemodynamically significant stenosis. --External carotid artery: No acute abnormality. LEFT CAROTID SYSTEM: --Common carotid artery: Widely patent origin without common carotid artery dissection or aneurysm. --Internal carotid artery:No dissection, occlusion or aneurysm. Mild  atherosclerotic calcification at the carotid bifurcation without hemodynamically significant stenosis. --External carotid artery: No acute abnormality. VERTEBRAL ARTERIES: Codominant configuration. Both origins are normal. No dissection, occlusion or flow-limiting stenosis to the vertebrobasilar confluence. SKELETON: There is no bony spinal canal stenosis. No lytic or blastic lesion. OTHER NECK: Normal pharynx, larynx and major salivary glands. No cervical lymphadenopathy. Unremarkable thyroid gland. UPPER CHEST: No pneumothorax or pleural effusion. No nodules or masses. CTA HEAD FINDINGS ANTERIOR CIRCULATION: --Intracranial internal carotid arteries: Normal. --Anterior cerebral arteries: Normal. Both A1 segments are present. Patent anterior communicating artery. --Middle cerebral arteries: Normal. --Posterior communicating arteries: Present bilaterally. POSTERIOR CIRCULATION: --Basilar artery: Normal. --Posterior cerebral arteries: Normal. --Superior cerebellar arteries: Normal. --  Inferior cerebellar arteries: Normal anterior and posterior inferior cerebellar arteries. VENOUS SINUSES: As permitted by contrast timing, patent. ANATOMIC VARIANTS: None DELAYED PHASE: No parenchymal contrast enhancement. Left occipital lobe temporal encephalomalacia redemonstrated. Review of the MIP images confirms the above findings. IMPRESSION: 1. No emergent large vessel occlusion or hemodynamically significant stenosis of the head or neck. 2. Mild bilateral carotid bifurcation calcific atherosclerosis without hemodynamically significant stenosis. Electronically Signed   By: Ulyses Jarred M.D.   On: 06/30/2018 20:40   Dg Chest 2 View  Result Date: 06/30/2018 CLINICAL DATA:  Seizure at 1630 hours. EXAM: CHEST - 2 VIEW COMPARISON:  None. FINDINGS: Mild pulmonary vascular congestion without alveolar consolidation, effusion or pneumothorax. Heart size is top-normal. There is moderate aortic atherosclerosis. Surgical clips project  over the AP window. No acute fracture nor joint dislocations are apparent. IMPRESSION: Aortic atherosclerosis. Pulmonary vascular congestion consistent with mild CHF. Electronically Signed   By: Ashley Royalty M.D.   On: 06/30/2018 22:27   Ct Angio Neck W And/or Wo Contrast  Result Date: 06/30/2018 CLINICAL DATA:  Seizure EXAM: CT ANGIOGRAPHY HEAD AND NECK TECHNIQUE: Multidetector CT imaging of the head and neck was performed using the standard protocol during bolus administration of intravenous contrast. Multiplanar CT image reconstructions and MIPs were obtained to evaluate the vascular anatomy. Carotid stenosis measurements (when applicable) are obtained utilizing NASCET criteria, using the distal internal carotid diameter as the denominator. CONTRAST:  179mL ISOVUE-370 IOPAMIDOL (ISOVUE-370) INJECTION 76% COMPARISON:  Head CT 06/30/2018 FINDINGS: CTA NECK FINDINGS AORTIC ARCH: There is no calcific atherosclerosis of the aortic arch. There is no aneurysm, dissection or hemodynamically significant stenosis of the visualized ascending aorta and aortic arch. Conventional 3 vessel aortic branching pattern. The visualized proximal subclavian arteries are widely patent. RIGHT CAROTID SYSTEM: --Common carotid artery: Widely patent origin without common carotid artery dissection or aneurysm. --Internal carotid artery: No dissection, occlusion or aneurysm. Mild atherosclerotic calcification at the carotid bifurcation without hemodynamically significant stenosis. --External carotid artery: No acute abnormality. LEFT CAROTID SYSTEM: --Common carotid artery: Widely patent origin without common carotid artery dissection or aneurysm. --Internal carotid artery:No dissection, occlusion or aneurysm. Mild atherosclerotic calcification at the carotid bifurcation without hemodynamically significant stenosis. --External carotid artery: No acute abnormality. VERTEBRAL ARTERIES: Codominant configuration. Both origins are normal. No  dissection, occlusion or flow-limiting stenosis to the vertebrobasilar confluence. SKELETON: There is no bony spinal canal stenosis. No lytic or blastic lesion. OTHER NECK: Normal pharynx, larynx and major salivary glands. No cervical lymphadenopathy. Unremarkable thyroid gland. UPPER CHEST: No pneumothorax or pleural effusion. No nodules or masses. CTA HEAD FINDINGS ANTERIOR CIRCULATION: --Intracranial internal carotid arteries: Normal. --Anterior cerebral arteries: Normal. Both A1 segments are present. Patent anterior communicating artery. --Middle cerebral arteries: Normal. --Posterior communicating arteries: Present bilaterally. POSTERIOR CIRCULATION: --Basilar artery: Normal. --Posterior cerebral arteries: Normal. --Superior cerebellar arteries: Normal. --Inferior cerebellar arteries: Normal anterior and posterior inferior cerebellar arteries. VENOUS SINUSES: As permitted by contrast timing, patent. ANATOMIC VARIANTS: None DELAYED PHASE: No parenchymal contrast enhancement. Left occipital lobe temporal encephalomalacia redemonstrated. Review of the MIP images confirms the above findings. IMPRESSION: 1. No emergent large vessel occlusion or hemodynamically significant stenosis of the head or neck. 2. Mild bilateral carotid bifurcation calcific atherosclerosis without hemodynamically significant stenosis. Electronically Signed   By: Ulyses Jarred M.D.   On: 06/30/2018 20:40   Ct Head Code Stroke Wo Contrast  Result Date: 06/30/2018 CLINICAL DATA:  Code stroke. LEFT-sided weakness, RIGHT gaze. History of encephalitis. EXAM: CT HEAD WITHOUT CONTRAST TECHNIQUE:  Contiguous axial images were obtained from the base of the skull through the vertex without intravenous contrast. COMPARISON:  None. FINDINGS: BRAIN: No intraparenchymal hemorrhage, mass effect or acute large vascular territory infarct. Confluent LEFT temporal occipital encephalomalacia lesion. Small area RIGHT temporal lobe encephalomalacia. 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 Mild global parenchymal brain volume loss. No abnormal extra-axial fluid collections. VASCULAR: Mild calcific atherosclerosis of the carotid siphons. No dense intracranial vessels. SKULL: No skull fracture. No significant scalp soft tissue swelling. SINUSES/ORBITS: Trace paranasal sinus mucosal thickening. Mastoid air cells are well aerated.The included ocular globes and orbital contents are non-suspicious. OTHER: None. ASPECTS [facility] Stroke Program Early CT Score) - Ganglionic level infarction (caudate, lentiform nuclei, internal capsule, insula, M1-M3 cortex): 7 - Supraganglionic infarction (M4-M6 cortex): 3 Total score (0-10 with 10 being normal): 10 IMPRESSION: 1. No acute intracranial process. 2. ASPECTS is 10. 3. 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 4. Critical Value/emergent results were called by telephone at the time of interpretation on 06/30/2018 at 7:57 pm to Dr. Gerlene Fee , who verbally acknowledged these results. Electronically Signed   By: Elon Alas M.D.   On: 06/30/2018 19:58   Dg Lumbar Puncture Fluoro Guide  Result Date: 07/01/2018 CLINICAL DATA:  Altered mental status, possible CNS infection. Seizures. EXAM: DIAGNOSTIC LUMBAR PUNCTURE UNDER FLUOROSCOPIC GUIDANCE FLUOROSCOPY TIME:  Fluoroscopy Time:  0 minutes, 48 seconds Radiation Exposure Index (if provided by the fluoroscopic device): 8.4 mGy Number of Acquired Spot Images: 0 PROCEDURE: I discussed the risks (including hemorrhage, infection, headache, and nerve damage, among others), benefits, and alternatives to fluoroscopically guided lumbar puncture with the patient's husband, who is consenting on behalf of the patient. We specifically discussed the high technical likelihood of success of the procedure. The patient's husband understood and elected for the patient to  undergo the procedure. Standard time-out was employed. Following sterile skin prep and local anesthetic administration consisting of 1 percent lidocaine, a 22 gauge spinal needle was advanced without difficulty into the thecal sac at the at the L3-4 level. Clear CSF was returned. Opening pressure was not obtained. 12 cc of clear CSF was collected. The needle was subsequently removed and the skin cleansed and bandaged. No immediate complications were observed. IMPRESSION: 1. Successful fluoroscopically guided lumbar puncture yielding 12 cc of clear CSF which was sent to the lab for the requested tests. Electronically Signed   By: Van Clines M.D.   On: 07/01/2018 16:49        Scheduled Meds: . calcium-vitamin D  1 tablet Oral BID  . lacosamide  100 mg Oral BID  . levETIRAcetam  1,000 mg Oral Daily  . Vilazodone HCl  20 mg Oral Daily   Continuous Infusions: . acyclovir 600 mg (07/02/18 0600)     LOS: 2 days    Time spent: 25 minutes    Edwin Dada, MD [facility] 07/02/2018, 9:12 AM     Pager [phone] --- please page though AMION:  [email or web address] Password [email or web address] If 7PM-7AM, please contact night-coverage

## 2018-07-02 NOTE — Progress Notes (Signed)
Called to pt.'s room by other nurse for pt. W/ dyspnea and CP. Upon arrival pt. sats 94%. Pt. C/o mild CP in the medial chest. No change in pain with touching the area. Dr. Loleta Books paged, new orders received.   Upon arrival back to pt.'s room noted pt. Has rash on chest and abdomen. Vanc running from previous nurse admin, stopped per dc orders. Dr. Loleta Books notified.   15 minutes later pt. States she feels a lot better with no CP.

## 2018-07-02 NOTE — Progress Notes (Addendum)
Subjective: Patient is complaining of abdominal pain.  Husband is at bedside saying she is confabulating.  States that she is a poor historian.  At one point she is stating that she has "bugs" in her stomach secondary to a medication that was given to her, husband then spoke up and stated that she did have a Lyme or tick bite in the past that did have a reaction and he believes this is what she is talking about.  When asked where she is, she is unable to answer any questions however, she does know her husband is in the room.  Patient is complaining about at some point in time during hospitalization there was a mistake in her medication but cannot give me any further information.   Exam: Vitals:   07/02/18 0345 07/02/18 0738  BP: 124/67 (!) 111/56  Pulse:  75  Resp: 18 18  Temp:  98.4 F (36.9 C)  SpO2: 94% 94%    Physical Exam   HEENT-  No nuchal rigidity. Normocephalic, no lesions, without obvious abnormality.  Normal external eye and conjunctiva.   Extremities- Warm, dry and intact Musculoskeletal-no joint tenderness, deformity or swelling Skin-warm and dry, no hyperpigmentation, vitiligo, or suspicious lesions  Neuro:  Mental Status: Alert, not oriented to place, month, city however does know the name of her husband who is sitting in the room.  Speech slightly dysarthric without evidence of aphasia. Good eye contact. Able to follow 3 step commands without difficulty. Cranial Nerves: II:  Visual fields grossly normal  III,IV, VI: ptosis not present, extra-ocular motions intact bilaterally pupils equal, round, reactive to light and accommodation V,VII: smile symmetric, facial light touch sensation normal bilaterally VIII: hearing normal bilaterally IX,X: uvula rises midline XI: bilateral shoulder shrug XII: midline tongue extension Motor: Right : Upper extremity   5/5    Left:     Upper extremity   5/5  Lower extremity   5/5     Lower extremity   5/5 Tone and bulk:normal tone  throughout; no atrophy noted Sensory: Pinprick and light touch intact throughout, bilaterally Deep Tendon Reflexes: 2+ and symmetric throughout Plantars: Right: downgoing   Left: downgoing     Medications:  Scheduled: . calcium-vitamin D  1 tablet Oral BID  . lacosamide  100 mg Oral BID  . levETIRAcetam  1,000 mg Oral Daily  . Vilazodone HCl  20 mg Oral Daily   Continuous: . acyclovir 600 mg (07/02/18 0600)    Pertinent Labs/Diagnostics: EEG: This is an abnormal electroencephalogram secondary to intermittent sharp transient, most notable during drowsy and sleep, noted in the right frontal region with phase reversal at F8.   Lumbar puncture results: Results for MATTISYN, CARDONA (MRN [id number]) as of 07/02/2018 09:27  Ref. Range 07/01/2018 16:37  Appearance, CSF Latest Ref Range: CLEAR  CLEAR  Glucose, CSF Latest Ref Range: 40 - 70 mg/dL 65  RBC Count, CSF Latest Ref Range: 0 /cu mm 21 (H)  WBC, CSF Latest Ref Range: 0 - 5 /cu mm 1  Other Cells, CSF Unknown TOO FEW TO COUNT, SMEAR AVAILABLE FOR REVIEW  Color, CSF Latest Ref Range: COLORLESS  COLORLESS  Supernatant Unknown NOT INDICATED  Total  Protein, CSF Latest Ref Range: 15 - 45 mg/dL 45  Tube # Unknown 1   CSF HSV pending CSF enterovirus pending CSF culture pending CSF Gram stain pending   -MRI pending  Ct Angio Neck W And/or Wo Contrast  . IMPRESSION: 1. No emergent large vessel occlusion or  hemodynamically significant stenosis of the head or neck. 2. Mild bilateral carotid bifurcation calcific atherosclerosis without hemodynamically significant stenosis. Electronically Signed   By: Ulyses Jarred M.D.   On: 06/30/2018 20:40   Ct Head Code Stroke Wo Contrast  Result Date: 06/30/2018  IMPRESSION: 1. No acute intracranial process. 2. ASPECTS is 10. 3. 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 4. Critical Value/emergent results were  called by telephone at the time of interpretation on 06/30/2018 at 7:57 pm to Dr. Gerlene Fee , who verbally acknowledged these results. Electronically Signed   By: Elon Alas M.D.   On: 06/30/2018 19:58   Dg Lumbar Puncture Fluoro Guide  Result Date: 07/01/2018  IMPRESSION: 1. Successful fluoroscopically guided lumbar puncture yielding 12 cc of clear CSF which was sent to the lab for the requested tests. Electronically Signed   By: Van Clines M.D.   On: 07/01/2018 16:49     Etta Quill PA-C [facility] [phone]   Assessment:  1.  Partial simple seizure disorder, presenting with several ecurrences in one day, followed by a GTC prior to admission. Currently on Keppra 1000 twice daily and Vimpat 100 mg twice daily 2.  EEG has been done which does not show any seizure activity 3.  History of dementia 4. Presentation with fever on day of admission was also suggestive of a possible encephalitis/meningitis. Given normal WBC, protein and glucose, most likely can discontinue antibiotics. However, antibiotics were started shortly before LP was obtained, therefore would recommend ID input for final recommendations  Recommendations: -Neurology will follow MRI, if MRI is negative we will sign off  --Input from ID regarding likely discontinuation of empiric meningitis dose antibiotics.  Electronically signed: Dr. Kerney Elbe 07/02/2018, 9:13 AM

## 2018-07-02 NOTE — Progress Notes (Signed)
Called by telesitter to room. Pt. Ripped out her IV despite telesitter telling her to leave it alone. Pt. Stating she "doesn't need it anymore." MRI put on hold due to no IV access.

## 2018-07-02 NOTE — Progress Notes (Signed)
PT Cancellation Note  Patient Details Name: Venise Ellingwood MRN: [id number] DOB: 12-29-1945   Cancelled Treatment:    Reason Eval/Treat Not Completed: Other (comment) RN reporting pt just took out IV. Requesting to hold PT until pt gets new IV and has MRI. Will follow up as schedule allows.   Leighton Ruff, PT, DPT  Acute Rehabilitation Services  Pager: [phone] Office: [phone]  Rudean Hitt 07/02/2018, 10:53 AM

## 2018-07-03 DIAGNOSIS — R509 Fever, unspecified: Secondary | ICD-10-CM

## 2018-07-03 LAB — CBC
HCT: 44.5 % (ref 36.0–46.0)
Hemoglobin: 14.2 g/dL (ref 12.0–15.0)
MCH: 29.6 pg (ref 26.0–34.0)
MCHC: 31.9 g/dL (ref 30.0–36.0)
MCV: 92.7 fL (ref 78.0–100.0)
Platelets: 294 10*3/uL (ref 150–400)
RBC: 4.8 MIL/uL (ref 3.87–5.11)
RDW: 13.2 % (ref 11.5–15.5)
WBC: 13.7 10*3/uL — ABNORMAL HIGH (ref 4.0–10.5)

## 2018-07-03 LAB — BASIC METABOLIC PANEL
Anion gap: 7 (ref 5–15)
BUN: 10 mg/dL (ref 8–23)
CHLORIDE: 111 mmol/L (ref 98–111)
CO2: 24 mmol/L (ref 22–32)
CREATININE: 0.59 mg/dL (ref 0.44–1.00)
Calcium: 8.6 mg/dL — ABNORMAL LOW (ref 8.9–10.3)
GFR calc Af Amer: >60 mL/min (ref >60)
GFR calc non Af Amer: >60 mL/min (ref >60)
Glucose, Bld: 97 mg/dL (ref 70–99)
Potassium: 3.8 mmol/L (ref 3.5–5.1)
SODIUM: 142 mmol/L (ref 135–145)

## 2018-07-03 NOTE — Discharge Summary (Signed)
Physician Discharge Summary  Mallory Boone YHC:[id number] DOB: [birth year] DOA: 06/30/2018  PCP: [facility]  Admit date: 06/30/2018 Discharge date: 07/03/2018  Admitted From: Home  Disposition:  Home   Recommendations for Outpatient Follow-up:  1. Follow up with PCP in 1-2 weeks 2. Follow up with Neurology in 4-6 weeks    Home Health: Yes  Equipment/Devices: Rolling walker  Discharge Condition: Fair  CODE STATUS: FULL Diet recommendation: Regular  Brief/Interim Summary: Mallory Boone is a 72 y.o. 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   Patient had been in normal health until day of admission, family noticed that she was having multiple of her typical partial seizures, progressively more frequent until evening when she had a generalized tonic-clonic seizure and was brought tot he ER. There was some initial concern for stroke, but CT head and CTA head and neck were normal and it was determined to be Todd paralysis. She was loaded with Dilantin. Subsequently noted to have fever to 101.55F, LP failed in ER, started empirically on acyclovir, ceftriaxone and Vancomycin.      Discharge Diagnoses:   Ruled out meningoencephalitis Admitted with fever, headache, seizure, confusion.  Started on empiric antibiotics before LP could be obtained.  LP obtained that showed no pleocytosis (no WBCs in tubes 1 or 4, no red cells tube 4).    Gram stain negative. HSV PCR negative on CSF, MR of brain no acute change, only old known encephalomalacia.   Discussed with ID, antibiotics discontinued.  Patient continued to improve.  Discharge home with [facility].   Seizures Presumably febrile illness prompted breakthrough seizures.  Vimpat and Keppra doses continued, no change.  She was referred for follow up with her primary  Neurologist.  Rash Red maculopapular rash appeared on the neck, face, upper chest, abdomen, and extending to legs.  This temporally followed vancomycin most closely, appeared within 24 hours of ampicillin, both of which were stopped around the time of appearance of rash.     Dementia Mood disorder Home Vilazodone was continued.    Discharge Instructions  Discharge Instructions    Ambulatory referral to Neurology   Complete by:  As directed    An appointment is requested in approximately: 4-6 weeks   Diet general   Complete by:  As directed    Discharge instructions   Complete by:  As directed    From Dr. Loleta Boone: You were admitted for fever and seizure.   At first, there was a high concern that you had a brain infection (meningitis or encephalitis). Thankfully, your testing was normal (your MRI showed only the known atrophy, necrosis or "encephalomalacia" that was already known, nothing new or concerning; your cerebrospinal fluid testing from the lumbar puncture showed no signs of infection or inflammation; you tested negative for flu and for pneumonia)  Likely this was a common cold, some other illness provoked the fever, and the fever lowered your seizure threshold.  Follow up with Mallory Boone when you are able.  I have sent a request to Dr. Amparo Boone office for them to contact your for an appointment in 4-6 weeks, call them at [phone] if you haven't heard back in a week   Increase activity slowly   Complete by:  As directed      Allergies as of 07/03/2018      Reactions   Ampicillin Rash  Red maculopapular rash occurred in context of coadministration of ampicillin, vancomycin, and ceftriaxone      Medication List    TAKE these medications   alendronate 70 MG tablet Commonly known as:  FOSAMAX Take 70 mg by mouth once a week. Take with a full glass of water on an empty stomach.   calcium-vitamin D 500-200 MG-UNIT tablet Commonly known as:   OSCAL WITH D Take 1 tablet by mouth.   Lacosamide 100 MG Tabs Take 1 tablet twice a day   levETIRAcetam 500 MG 24 hr tablet Commonly known as:  KEPPRA XR Take 4 tablets every night   VIIBRYD 20 MG Tabs Generic drug:  Vilazodone HCl Take 20 mg by mouth daily.            Durable Medical Equipment  (From admission, onward)         Start     Ordered   07/03/18 1152  For home use only DME Walker rolling  Once    Question:  Patient needs a walker to treat with the following condition  Answer:  Encephalopathy   07/03/18 1152          Allergies  Allergen Reactions  . Ampicillin Rash    Red maculopapular rash occurred in context of coadministration of ampicillin, vancomycin, and ceftriaxone    Consultations:  Neurology   Procedures/Studies: Ct Angio Head W Or Wo Contrast  Result Date: 06/30/2018 CLINICAL DATA:  Seizure EXAM: CT ANGIOGRAPHY HEAD AND NECK TECHNIQUE: Multidetector CT imaging of the head and neck was performed using the standard protocol during bolus administration of intravenous contrast. Multiplanar CT image reconstructions and MIPs were obtained to evaluate the vascular anatomy. Carotid stenosis measurements (when applicable) are obtained utilizing NASCET criteria, using the distal internal carotid diameter as the denominator. CONTRAST:  119mL ISOVUE-370 IOPAMIDOL (ISOVUE-370) INJECTION 76% COMPARISON:  Head CT 06/30/2018 FINDINGS: CTA NECK FINDINGS AORTIC ARCH: There is no calcific atherosclerosis of the aortic arch. There is no aneurysm, dissection or hemodynamically significant stenosis of the visualized ascending aorta and aortic arch. Conventional 3 vessel aortic branching pattern. The visualized proximal subclavian arteries are widely patent. RIGHT CAROTID Boone: --Common carotid artery: Widely patent origin without common carotid artery dissection or aneurysm. --Internal carotid artery: No dissection, occlusion or aneurysm. Mild atherosclerotic  calcification at the carotid bifurcation without hemodynamically significant stenosis. --External carotid artery: No acute abnormality. LEFT CAROTID Boone: --Common carotid artery: Widely patent origin without common carotid artery dissection or aneurysm. --Internal carotid artery:No dissection, occlusion or aneurysm. Mild atherosclerotic calcification at the carotid bifurcation without hemodynamically significant stenosis. --External carotid artery: No acute abnormality. VERTEBRAL ARTERIES: Codominant configuration. Both origins are normal. No dissection, occlusion or flow-limiting stenosis to the vertebrobasilar confluence. SKELETON: There is no bony spinal canal stenosis. No lytic or blastic lesion. OTHER NECK: Normal pharynx, larynx and major salivary glands. No cervical lymphadenopathy. Unremarkable thyroid gland. UPPER CHEST: No pneumothorax or pleural effusion. No nodules or masses. CTA HEAD FINDINGS ANTERIOR CIRCULATION: --Intracranial internal carotid arteries: Normal. --Anterior cerebral arteries: Normal. Both A1 segments are present. Patent anterior communicating artery. --Middle cerebral arteries: Normal. --Posterior communicating arteries: Present bilaterally. POSTERIOR CIRCULATION: --Basilar artery: Normal. --Posterior cerebral arteries: Normal. --Superior cerebellar arteries: Normal. --Inferior cerebellar arteries: Normal anterior and posterior inferior cerebellar arteries. VENOUS SINUSES: As permitted by contrast timing, patent. ANATOMIC VARIANTS: None DELAYED PHASE: No parenchymal contrast enhancement. Left occipital lobe temporal encephalomalacia redemonstrated. Review of the MIP images confirms the above findings. IMPRESSION: 1. No emergent large vessel  occlusion or hemodynamically significant stenosis of the head or neck. 2. Mild bilateral carotid bifurcation calcific atherosclerosis without hemodynamically significant stenosis. Electronically Signed   By: Ulyses Jarred M.D.   On: 06/30/2018  20:40   Dg Chest 2 View  Result Date: 06/30/2018 CLINICAL DATA:  Seizure at 1630 hours. EXAM: CHEST - 2 VIEW COMPARISON:  None. FINDINGS: Mild pulmonary vascular congestion without alveolar consolidation, effusion or pneumothorax. Heart size is top-normal. There is moderate aortic atherosclerosis. Surgical clips project over the AP window. No acute fracture nor joint dislocations are apparent. IMPRESSION: Aortic atherosclerosis. Pulmonary vascular congestion consistent with mild CHF. Electronically Signed   By: Ashley Royalty M.D.   On: 06/30/2018 22:27   Ct Angio Neck W And/or Wo Contrast  Result Date: 06/30/2018 CLINICAL DATA:  Seizure EXAM: CT ANGIOGRAPHY HEAD AND NECK TECHNIQUE: Multidetector CT imaging of the head and neck was performed using the standard protocol during bolus administration of intravenous contrast. Multiplanar CT image reconstructions and MIPs were obtained to evaluate the vascular anatomy. Carotid stenosis measurements (when applicable) are obtained utilizing NASCET criteria, using the distal internal carotid diameter as the denominator. CONTRAST:  194mL ISOVUE-370 IOPAMIDOL (ISOVUE-370) INJECTION 76% COMPARISON:  Head CT 06/30/2018 FINDINGS: CTA NECK FINDINGS AORTIC ARCH: There is no calcific atherosclerosis of the aortic arch. There is no aneurysm, dissection or hemodynamically significant stenosis of the visualized ascending aorta and aortic arch. Conventional 3 vessel aortic branching pattern. The visualized proximal subclavian arteries are widely patent. RIGHT CAROTID Boone: --Common carotid artery: Widely patent origin without common carotid artery dissection or aneurysm. --Internal carotid artery: No dissection, occlusion or aneurysm. Mild atherosclerotic calcification at the carotid bifurcation without hemodynamically significant stenosis. --External carotid artery: No acute abnormality. LEFT CAROTID Boone: --Common carotid artery: Widely patent origin without common carotid  artery dissection or aneurysm. --Internal carotid artery:No dissection, occlusion or aneurysm. Mild atherosclerotic calcification at the carotid bifurcation without hemodynamically significant stenosis. --External carotid artery: No acute abnormality. VERTEBRAL ARTERIES: Codominant configuration. Both origins are normal. No dissection, occlusion or flow-limiting stenosis to the vertebrobasilar confluence. SKELETON: There is no bony spinal canal stenosis. No lytic or blastic lesion. OTHER NECK: Normal pharynx, larynx and major salivary glands. No cervical lymphadenopathy. Unremarkable thyroid gland. UPPER CHEST: No pneumothorax or pleural effusion. No nodules or masses. CTA HEAD FINDINGS ANTERIOR CIRCULATION: --Intracranial internal carotid arteries: Normal. --Anterior cerebral arteries: Normal. Both A1 segments are present. Patent anterior communicating artery. --Middle cerebral arteries: Normal. --Posterior communicating arteries: Present bilaterally. POSTERIOR CIRCULATION: --Basilar artery: Normal. --Posterior cerebral arteries: Normal. --Superior cerebellar arteries: Normal. --Inferior cerebellar arteries: Normal anterior and posterior inferior cerebellar arteries. VENOUS SINUSES: As permitted by contrast timing, patent. ANATOMIC VARIANTS: None DELAYED PHASE: No parenchymal contrast enhancement. Left occipital lobe temporal encephalomalacia redemonstrated. Review of the MIP images confirms the above findings. IMPRESSION: 1. No emergent large vessel occlusion or hemodynamically significant stenosis of the head or neck. 2. Mild bilateral carotid bifurcation calcific atherosclerosis without hemodynamically significant stenosis. Electronically Signed   By: Ulyses Jarred M.D.   On: 06/30/2018 20:40   Mr Jeri Cos [place] Contrast  Result Date: 07/02/2018 CLINICAL DATA:  Increased frequency of seizures. Altered mental status. EXAM: MRI HEAD WITHOUT AND WITH CONTRAST TECHNIQUE: Multiplanar, multiecho pulse sequences of the  brain and surrounding structures were obtained without and with intravenous contrast. CONTRAST:  10 mL Gadavist COMPARISON:  Head CT/CTA 06/30/2018 FINDINGS: BRAIN: There is no acute infarct, acute hemorrhage or mass effect. The midline structures are normal. Large area of encephalomalacia  at the anterior left temporal lobe. Mild periventricular white matter hyperintensity, greatest at the left frontal horn. Ex vacuo dilatation of the left lateral ventricle. Susceptibility-sensitive sequences show no chronic microhemorrhage or superficial siderosis. VASCULAR: Major intracranial arterial and venous sinus flow voids are preserved. SKULL AND UPPER CERVICAL SPINE: The visualized skull base, calvarium, upper cervical spine and extracranial soft tissues are normal. SINUSES/ORBITS: No fluid levels or advanced mucosal thickening. No mastoid or middle ear effusion. The orbits are normal. IMPRESSION: 1. No acute ischemia. 2. 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 Electronically Signed   By: Ulyses Jarred M.D.   On: 07/02/2018 19:18   Dg Chest Port 1 View  Result Date: 07/02/2018 CLINICAL DATA:  Acute onset of chest tightness EXAM: PORTABLE CHEST 1 VIEW COMPARISON:  06/30/2018 FINDINGS: Cardiac shadow is mildly enlarged but stable. Aortic calcifications are again seen. Postsurgical changes in the left hilum are noted. The lungs are well aerated bilaterally. Previously seen vascular congestion has resolved. No focal infiltrate or effusion is seen. No bony abnormality is noted. IMPRESSION: No acute abnormality seen. Resolution of previously noted vascular congestion. Electronically Signed   By: Inez Catalina M.D.   On: 07/02/2018 11:15   Ct Head Code Stroke Wo Contrast  Result Date: 06/30/2018 CLINICAL DATA:  Code stroke. LEFT-sided weakness, RIGHT gaze. History of encephalitis. EXAM: CT HEAD WITHOUT CONTRAST TECHNIQUE: Contiguous axial images were  obtained from the base of the skull through the vertex without intravenous contrast. COMPARISON:  None. FINDINGS: BRAIN: No intraparenchymal hemorrhage, mass effect or acute large vascular territory infarct. Confluent LEFT temporal occipital encephalomalacia lesion. Small area RIGHT temporal lobe encephalomalacia. 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 Mild global parenchymal brain volume loss. No abnormal extra-axial fluid collections. VASCULAR: Mild calcific atherosclerosis of the carotid siphons. No dense intracranial vessels. SKULL: No skull fracture. No significant scalp soft tissue swelling. SINUSES/ORBITS: Trace paranasal sinus mucosal thickening. Mastoid air cells are well aerated.The included ocular globes and orbital contents are non-suspicious. OTHER: None. ASPECTS [facility] Stroke Program Early CT Score) - Ganglionic level infarction (caudate, lentiform nuclei, internal capsule, insula, M1-M3 cortex): 7 - Supraganglionic infarction (M4-M6 cortex): 3 Total score (0-10 with 10 being normal): 10 IMPRESSION: 1. No acute intracranial process. 2. ASPECTS is 10. 3. 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 4. Critical Value/emergent results were called by telephone at the time of interpretation on 06/30/2018 at 7:57 pm to Dr. Gerlene Fee , who verbally acknowledged these results. Electronically Signed   By: Elon Alas M.D.   On: 06/30/2018 19:58   Dg Lumbar Puncture Fluoro Guide  Result Date: 07/01/2018 CLINICAL DATA:  Altered mental status, possible CNS infection. Seizures. EXAM: DIAGNOSTIC LUMBAR PUNCTURE UNDER FLUOROSCOPIC GUIDANCE FLUOROSCOPY TIME:  Fluoroscopy Time:  0 minutes, 48 seconds Radiation Exposure Index (if provided by the fluoroscopic device): 8.4 mGy Number of Acquired Spot Images: 0 PROCEDURE: I discussed the risks (including hemorrhage, infection,  headache, and nerve damage, among others), benefits, and alternatives to fluoroscopically guided lumbar puncture with the patient's husband, who is consenting on behalf of the patient. We specifically discussed the high technical likelihood of success of the procedure. The patient's husband understood and elected for the patient to undergo the procedure. Standard time-out was employed. Following sterile skin prep and local anesthetic administration consisting of 1 percent lidocaine, a 22 gauge spinal needle was advanced without difficulty into the  thecal sac at the at the L3-4 level. Clear CSF was returned. Opening pressure was not obtained. 12 cc of clear CSF was collected. The needle was subsequently removed and the skin cleansed and bandaged. No immediate complications were observed. IMPRESSION: 1. Successful fluoroscopically guided lumbar puncture yielding 12 cc of clear CSF which was sent to the lab for the requested tests. Electronically Signed   By: Van Clines M.D.   On: 07/01/2018 16:49       Subjective: Feeling better today.  Rash is asymptomatic.  No more headache.  Mentation at abseline.  No more fever, neck pain, photophobia.  No cough, sputum, dysuria.  Discharge Exam: Vitals:   07/03/18 0300 07/03/18 1155  BP: 106/65 136/80  Pulse: 68 77  Resp: 18 18  Temp: 97.6 F (36.4 C) 98 F (36.7 C)  SpO2:  95%   Vitals:   07/02/18 2050 07/03/18 0040 07/03/18 0300 07/03/18 1155  BP: 138/70 101/82 106/65 136/80  Pulse: 84 79 68 77  Resp: (!) 22 20 18 18   Temp: 98.8 F (37.1 C) 98.6 F (37 C) 97.6 F (36.4 C) 98 F (36.7 C)  TempSrc: Oral Oral Oral Oral  SpO2: 97% 97%  95%  Weight:      Height:        General: Pt is alert, awake, not in acute distress, sitting up in bed eating breakfast Cardiovascular: RRR, S1/S2 +, no rubs, no gallops Respiratory: CTA bilaterally, no wheezing, no rhonchi Abdominal: Soft, NT, ND, bowel sounds + Extremities: no edema, no  cyanosis    The results of significant diagnostics from this hospitalization (including imaging, microbiology, ancillary and laboratory) are listed below for reference.     Microbiology: Recent Results (from the past 240 hour(s))  Blood culture (routine x 2)     Status: None (Preliminary result)   Collection Time: 06/30/18  9:03 PM  Result Value Ref Range Status   Specimen Description   Final    BLOOD LEFT HAND Performed at [facility] [address] [place], [place] [address]    Special Requests   Final    BOTTLES DRAWN AEROBIC AND ANAEROBIC Blood Culture adequate volume Performed at [facility] [address], [place], [place] [address]    Culture   Final    NO GROWTH 3 DAYS Performed at [facility], [place] [address], [place], [place] [address]    Report Status PENDING  Incomplete  CSF culture     Status: None (Preliminary result)   Collection Time: 07/01/18  4:37 PM  Result Value Ref Range Status   Specimen Description CSF  Final   Special Requests NONE  Final   Gram Stain   Final    WBC PRESENT,BOTH PMN AND MONONUCLEAR NO ORGANISMS SEEN CYTOSPIN SMEAR    Culture   Final    NO GROWTH 2 DAYS Performed at [facility], [place] [address], [place], [place] [address]    Report Status PENDING  Incomplete     Labs: BNP (last 3 results) No results for input(s): BNP in the last 8760 hours. Basic Metabolic Panel: Recent Labs  Lab 06/30/18 1958 06/30/18 2004 07/01/18 0607 07/02/18 0430 07/03/18 0548  NA 138 138 138 141 142  K 4.9 4.9 3.2* 3.9 3.8  CL 103 104 105 106 111  CO2 23  --  24 25 24   GLUCOSE 165* 172* 94 92 97  BUN 15 18 12 9 10   CREATININE 0.78  0.60 0.69 0.68 0.59  CALCIUM 9.2  --  8.0* 8.3* 8.6*   Liver Function Tests: Recent Labs  Lab 06/30/18 1958 07/01/18 0607  AST 33 17  ALT 12 13  ALKPHOS 67 60  BILITOT 1.2 0.7  PROT 7.2 6.0*  ALBUMIN 3.5 3.0*   No results for input(s): LIPASE, AMYLASE  in the last 168 hours. No results for input(s): AMMONIA in the last 168 hours. CBC: Recent Labs  Lab 06/30/18 1958 06/30/18 2004 07/01/18 0607 07/02/18 0430 07/03/18 0548  WBC 13.0*  --  16.7* 13.2* 13.7*  NEUTROABS 9.5*  --   --   --   --   HGB 14.1 12.6 12.4 13.1 14.2  HCT 43.0 37.0 39.1 40.9 44.5  MCV 91.9  --  92.7 94.2 92.7  PLT 406*  --  307 292 294   Cardiac Enzymes: No results for input(s): CKTOTAL, CKMB, CKMBINDEX, TROPONINI in the last 168 hours. BNP: Invalid input(s): POCBNP CBG: Recent Labs  Lab 06/30/18 2018  GLUCAP 151*   D-Dimer No results for input(s): DDIMER in the last 72 hours. Hgb A1c No results for input(s): HGBA1C in the last 72 hours. Lipid Profile No results for input(s): CHOL, HDL, LDLCALC, TRIG, CHOLHDL, LDLDIRECT in the last 72 hours. Thyroid function studies Recent Labs    07/01/18 0607  TSH 0.377   Anemia work up No results for input(s): VITAMINB12, FOLATE, FERRITIN, TIBC, IRON, RETICCTPCT in the last 72 hours. Urinalysis    Component Value Date/Time   COLORURINE YELLOW 06/30/2018 2044   APPEARANCEUR CLEAR 06/30/2018 2044   LABSPEC 1.030 06/30/2018 2044   PHURINE 9.0 (H) 06/30/2018 2044   GLUCOSEU NEGATIVE 06/30/2018 2044   HGBUR NEGATIVE 06/30/2018 2044   BILIRUBINUR NEGATIVE 06/30/2018 2044   KETONESUR NEGATIVE 06/30/2018 2044   PROTEINUR NEGATIVE 06/30/2018 2044   NITRITE NEGATIVE 06/30/2018 2044   LEUKOCYTESUR NEGATIVE 06/30/2018 2044   Sepsis Labs Invalid input(s): PROCALCITONIN,  WBC,  LACTICIDVEN Microbiology Recent Results (from the past 240 hour(s))  Blood culture (routine x 2)     Status: None (Preliminary result)   Collection Time: 06/30/18  9:03 PM  Result Value Ref Range Status   Specimen Description   Final    BLOOD LEFT HAND Performed at [facility], [place] [address], [place], [place] [address]    Special Requests   Final    BOTTLES DRAWN AEROBIC AND ANAEROBIC Blood Culture adequate  volume Performed at [facility] [address] [place], [place] [address]    Culture   Final    NO GROWTH 3 DAYS Performed at [facility], [place] [address] [place], [place] [address]    Report Status PENDING  Incomplete  CSF culture     Status: None (Preliminary result)   Collection Time: 07/01/18  4:37 PM  Result Value Ref Range Status   Specimen Description CSF  Final   Special Requests NONE  Final   Gram Stain   Final    WBC PRESENT,BOTH PMN AND MONONUCLEAR NO ORGANISMS SEEN CYTOSPIN SMEAR    Culture   Final    NO GROWTH 2 DAYS Performed at [facility], [place] [address], [place], [place] [address]    Report Status PENDING  Incomplete     Time coordinating discharge: 40 minutes       SIGNED:   Edwin Dada, MD  [facility] 07/03/2018, 2:31 PM

## 2018-07-03 NOTE — Care Management Important Message (Signed)
Important Message  Patient Details  Name: Mallory Boone MRN: [id number] Date of Birth: Aug 12, 1946   Medicare Important Message Given:  Yes    Orbie Pyo 07/03/2018, 2:07 PM

## 2018-07-03 NOTE — Progress Notes (Signed)
Late entry : Called by tele sitter last night around 2030 that pt trying to get oob  and not follow directions, nurse went into pt room and found her agitated  and almost oob and pulled off external catheter very confused Redirection and reorientation unsuccessful. Notedt red rashes all over pt.s body  r/t previous reaction to some medication,Rn found t patient incontinent of urine, anxious and c/o itchiness. Pt care rended, new MD orderers received and administered as pt continue to be restless and agitated. Tele monitor d/c and safety sitter initiated    as pt remained agitated and pulled out IV lines.  An antihistamine IM given for the itching with some effect, New IV replaced. . Pt remained on 1:1 closed observation  to prevent self- injury & maintain safety.

## 2018-07-03 NOTE — Care Management Note (Signed)
Case Management Note  Patient Details  Name: Corrie Reder MRN: [id number] Date of Birth: 1946-05-20  Subjective/Objective:                    Action/Plan: Pt discharging home with orders for [facility] services. CM provided them choice and they selected [facility]. Tammy with [facility] notified and accepted the referral. Pt with orders for walker. James with [facility] DME notified and will deliver to the room. Spouse states he will provide 24 hour supervision at home. He is also providing transportation to home.  Expected Discharge Date:  07/03/18               Expected Discharge Plan:  [place]  In-House Referral:     Discharge planning Services  CM Consult  Post Acute Care Choice:  Home Health, Durable Medical Equipment Choice offered to:  Patient, Spouse  DME Arranged:  Walker rolling DME Agency:  [place] Arranged:  PT, OT [facility] Agency:  [facility]  Status of Service:  Completed, signed off  If discussed at [place] of Stay Meetings, dates discussed:    Additional Comments:  Pollie Friar, RN 07/03/2018, 3:15 PM

## 2018-07-03 NOTE — Progress Notes (Signed)
Pt discharge education and instructions completed with pt and spouse at bedside; both voices understanding and denies any questions. Pt IV and telemetry removed; pt discharge home with spouse to transport her home. Pt transported off unit via wheelchair with spouse and belongings to the side. Delia Heady RN

## 2018-07-03 NOTE — Progress Notes (Signed)
Physical Therapy Treatment Patient Details Name: Mallory Boone MRN: [id number] DOB: 1946-04-07 Today's Date: 07/03/2018    History of Present Illness Pt is a 72 y/o female admitted secondary to increased seizure activity. 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 Pt is s/p lumbar puncture on 9/24 and awaiting results. PMH includes dementia and encephalitis with resultant seizures.     PT Comments    Patient seen for mobility progression. Pt continues to demonstrate impaired cognition and balance deficits. Pt oriented to self only. Husband present during session. Pt will need to use RW initially upon d/c for safety with ambulation and pt and husband agreeable. Current plan remains appropriate.    Follow Up Recommendations  Home health PT;Supervision/Assistance - 24 hour     Equipment Recommendations  Rolling walker with 5" wheels    Recommendations for Other Services OT consult     Precautions / Restrictions Precautions Precautions: Fall;Other (comment) Precaution Comments: seizures  Restrictions Weight Bearing Restrictions: No    Mobility  Bed Mobility Overal bed mobility: Needs Assistance Bed Mobility: Supine to Sit;Sit to Supine     Supine to sit: Supervision     General bed mobility comments: supervision for safety  Transfers Overall transfer level: Needs assistance Equipment used: 1 person hand held assist Transfers: Sit to/from Stand Sit to Stand: Min assist         General transfer comment: assist for balance when standing   Ambulation/Gait Ambulation/Gait assistance: Min assist Gait Distance (Feet): 200 Feet Assistive device: 1 person hand held assist(assist at trunk with use of gait belt ) Gait Pattern/deviations: Step-through pattern;Decreased stride length;Drifts right/left Gait velocity: Decreased    General Gait Details: pt  requires min A for balance without use of AD; LOB X2 with assistance needed to recover   Stairs Stairs: Yes Stairs assistance: Min guard Stair Management: Two rails;Step to pattern;Forwards Number of Stairs: 2 General stair comments: assist to steady   Wheelchair Mobility    Modified Rankin (Stroke Patients Only) Modified Rankin (Stroke Patients Only) Pre-Morbid Rankin Score: Moderate disability Modified Rankin: Moderately severe disability     Balance Overall balance assessment: Needs assistance Sitting-balance support: No upper extremity supported;Feet supported Sitting balance-Leahy Scale: Good       Standing balance-Leahy Scale: Fair Standing balance comment: fair for static standing                             Cognition Arousal/Alertness: Awake/alert Behavior During Therapy: WFL for tasks assessed/performed Overall Cognitive Status: Impaired/Different from baseline Area of Impairment: Orientation;Memory;Following commands;Safety/judgement;Problem solving                 Orientation Level: Disoriented to;Place;Time;Situation   Memory: Decreased short-term memory Following Commands: Follows one step commands with increased time;Follows one step commands consistently Safety/Judgement: Decreased awareness of deficits;Decreased awareness of safety   Problem Solving: Requires verbal cues        Exercises      General Comments General comments (skin integrity, edema, etc.): husband present      Pertinent Vitals/Pain Pain Assessment: No/denies pain    Home Living                      Prior Function            PT Goals (current goals can now be found in the care plan section) Progress  towards PT goals: Progressing toward goals    Frequency    Min 3X/week      PT Plan Current plan remains appropriate    Co-evaluation              AM-PAC PT "6 Clicks" Daily Activity  Outcome Measure  Difficulty turning over in  bed (including adjusting bedclothes, sheets and blankets)?: A Little Difficulty moving from lying on back to sitting on the side of the bed? : Unable Difficulty sitting down on and standing up from a chair with arms (e.g., wheelchair, bedside commode, etc,.)?: Unable Help needed moving to and from a bed to chair (including a wheelchair)?: A Little Help needed walking in hospital room?: A Little Help needed climbing 3-5 steps with a railing? : A Lot 6 Click Score: 13    End of Session Equipment Utilized During Treatment: Gait belt Activity Tolerance: Patient tolerated treatment well Patient left: in bed;with call bell/phone within reach;with family/visitor present Nurse Communication: Mobility status PT Visit Diagnosis: Unsteadiness on feet (R26.81);Muscle weakness (generalized) (M62.81)     Time: 5009-3818 PT Time Calculation (min) (ACUTE ONLY): 31 min  Charges:  $Gait Training: 8-22 mins $Therapeutic Activity: 8-22 mins                     Earney Navy, PTA Acute Rehabilitation Services Pager: [phone] Office: [phone]     Darliss Cheney 07/03/2018, 10:40 AM

## 2018-07-03 NOTE — Progress Notes (Signed)
MRI brain: 1. No acute ischemia. 2. 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  The MRI finding of left temporal lobe encephalomalacia from prior encephalitis in 1986 is chronic in appearance with no acute changes seen. This most likely serves as a seizure onset zone. No changes to recommendations from yesterday's progress note. Neurology will sign off. Please call if there are additional questions.   Electronically signed: Dr. Kerney Elbe

## 2018-07-03 NOTE — Evaluation (Signed)
Occupational Therapy Evaluation Patient Details Name: Mallory Boone MRN: [id number] DOB: Feb 23, 1946 Today's Date: 07/03/2018    History of Present Illness Pt is a 72 y/o female admitted secondary to increased seizure activity. 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 Pt is s/p lumbar puncture on 9/24 and awaiting results. PMH includes dementia and encephalitis with resultant seizures.    Clinical Impression   Patient admitted for above and limited by cognition, safety, decreased activity tolerance, balance and generalized weakness.  Patient's spouse provides 24/7 support due to history of cognitive deficits, and reports she is at baseline.  She currently requires setup for UB ADL, supervision for LB dressing, min guard for shower transfers and min guard for LB bathing. Used RW for mobility and requires cueing for safety and walker management.  Patient and spouse educated on safety with completing dressing seated, and showering seated on chair (3:1 or shower chair); spouse agreeable after simulation of task. Patient will benefit from continued OT services while admitted and after dc at [facility] level in order to return to PLOF. Will continue to follow.     Follow Up Recommendations  Home health OT;Supervision/Assistance - 24 hour    Equipment Recommendations  3 in 1 bedside commode(spouse reports he will self purchase item )    Recommendations for Other Services       Precautions / Restrictions Precautions Precautions: Fall;Other (comment) Precaution Comments: seizures Restrictions Weight Bearing Restrictions: No      Mobility Bed Mobility               General bed mobility comments: seated in chair upon entry  Transfers Overall transfer level: Needs assistance Equipment used: Rolling walker (2 wheeled) Transfers: Sit to/from Stand Sit to Stand: Supervision          General transfer comment: cueing for hand placement and safety, pt attempting to pull from walker     Balance Overall balance assessment: Needs assistance Sitting-balance support: No upper extremity supported;Feet supported Sitting balance-Leahy Scale: Good     Standing balance support: During functional activity;No upper extremity supported Standing balance-Leahy Scale: Fair Standing balance comment: preference to UE support, min guard in standing for safety at times                           ADL either performed or assessed with clinical judgement   ADL Overall ADL's : Needs assistance/impaired     Grooming: Min guard;Standing       Lower Body Bathing: Min guard;Sit to/from stand Lower Body Bathing Details (indicate cue type and reason): reviewed safety and bathing seated on 3:1 or shower chair, spouse reports not needing and simulated task with noted heavy grasp on grabbar; spouse agreeable to chair in shower  Upper Body Dressing : Set up;Sitting   Lower Body Dressing: Sit to/from stand;Supervision/safety Lower Body Dressing Details (indicate cue type and reason): close supervision for safety in standing  Toilet Transfer: Supervision/safety;Ambulation;RW(simulated in room) Toilet Transfer Details (indicate cue type and reason): cueing for hand placement and walker management     Tub/ Shower Transfer: Walk-in shower;Min guard;Rolling walker;3 in 1;Grab bars;Cueing for safety Tub/Shower Transfer Details (indicate cue type and reason): min guard for safety  Functional mobility during ADLs: Supervision/safety;Rolling walker;Cueing for safety(cueing for walker management )       Vision Baseline Vision/History: Wears glasses Patient Visual Report: No change from baseline Vision  Assessment?: No apparent visual deficits     Perception     Praxis      Pertinent Vitals/Pain Pain Assessment: No/denies pain     Hand Dominance Right   Extremity/Trunk  Assessment Upper Extremity Assessment Upper Extremity Assessment: Overall WFL for tasks assessed   Lower Extremity Assessment Lower Extremity Assessment: Defer to PT evaluation   Cervical / Trunk Assessment Cervical / Trunk Assessment: Normal   Communication Communication Communication: No difficulties   Cognition Arousal/Alertness: Awake/alert Behavior During Therapy: WFL for tasks assessed/performed Overall Cognitive Status: History of cognitive impairments - at baseline Area of Impairment: Orientation;Memory;Following commands;Safety/judgement;Problem solving                 Orientation Level: Disoriented to;Place;Time;Situation   Memory: Decreased short-term memory Following Commands: Follows one step commands with increased time;Follows one step commands consistently Safety/Judgement: Decreased awareness of deficits;Decreased awareness of safety   Problem Solving: Requires verbal cues General Comments: spouse reports cognition is at baseline    General Comments  husband present     Exercises     Shoulder Instructions      Home Living Family/patient expects to be discharged to:: Private residence Living Arrangements: Spouse/significant other Available Help at Discharge: Family;Available 24 hours/day Type of Home: House Home Access: Stairs to enter [employer] of Steps: 3 Entrance Stairs-Rails: Right Home Layout: Two level;Able to live on main level with bedroom/bathroom     Bathroom Shower/Tub: Occupational psychologist: Handicapped height     Home Equipment: Crutches          Prior Functioning/Environment Level of Independence: Independent        Comments: spouse reports history of cognitive impairments, requires supervision for safety at times         OT Problem List: Decreased strength;Decreased activity tolerance;Impaired balance (sitting and/or standing);Decreased coordination;Decreased safety awareness;Decreased  cognition;Decreased knowledge of use of DME or AE;Decreased knowledge of precautions      OT Treatment/Interventions: Self-care/ADL training;Therapeutic exercise;DME and/or AE instruction;Patient/family education;Balance training    OT Goals(Current goals can be found in the care plan section) Acute Rehab OT Goals Patient Stated Goal: home today OT Goal Formulation: With patient/family Time For Goal Achievement: 07/10/18 Potential to Achieve Goals: Good  OT Frequency: Min 2X/week   Barriers to D/C:            Co-evaluation              AM-PAC PT "6 Clicks" Daily Activity     Outcome Measure Help from another person eating meals?: None Help from another person taking care of personal grooming?: A Little Help from another person toileting, which includes using toliet, bedpan, or urinal?: A Little Help from another person bathing (including washing, rinsing, drying)?: A Little Help from another person to put on and taking off regular upper body clothing?: None Help from another person to put on and taking off regular lower body clothing?: None 6 Click Score: 21   End of Session Equipment Utilized During Treatment: Rolling walker Nurse Communication: Mobility status  Activity Tolerance: Patient tolerated treatment well Patient left: in chair;with call bell/phone within reach;with family/visitor present  OT Visit Diagnosis: Other abnormalities of gait and mobility (R26.89);Other symptoms and signs involving cognitive function                Time: 1610-9604 OT Time Calculation (min): 14 min Charges:  OT General Charges $OT Visit: 1 Visit OT Evaluation $OT Eval Moderate Complexity: 1 Mod  Kathern Lobosco  [place], OT Acute Rehabilitation Services Pager [phone] Office [phone]   Delight Stare 07/03/2018, 2:59 PM

## 2018-07-04 ENCOUNTER — Encounter (HOSPITAL_COMMUNITY): Payer: Self-pay | Admitting: *Deleted

## 2018-07-04 ENCOUNTER — Emergency Department (HOSPITAL_COMMUNITY)
Admission: EM | Admit: 2018-07-04 | Discharge: 2018-07-04 | Disposition: A | Discharge status: Home / Self Care | Payer: Medicare Other | Attending: Emergency Medicine | Admitting: Emergency Medicine

## 2018-07-04 ENCOUNTER — Other Ambulatory Visit: Payer: Self-pay

## 2018-07-04 ENCOUNTER — Encounter (HOSPITAL_COMMUNITY): Payer: Self-pay | Admitting: Emergency Medicine

## 2018-07-04 ENCOUNTER — Ambulatory Visit (INDEPENDENT_AMBULATORY_CARE_PROVIDER_SITE_OTHER)
Admission: EM | Admit: 2018-07-04 | Discharge: 2018-07-04 | Disposition: A | Discharge status: Home / Self Care | Payer: Medicare Other | Source: Home / Self Care

## 2018-07-04 DIAGNOSIS — T50905A Adverse effect of unspecified drugs, medicaments and biological substances, initial encounter: Secondary | ICD-10-CM | POA: Insufficient documentation

## 2018-07-04 DIAGNOSIS — Z79899 Other long term (current) drug therapy: Secondary | ICD-10-CM | POA: Insufficient documentation

## 2018-07-04 DIAGNOSIS — L299 Pruritus, unspecified: Secondary | ICD-10-CM

## 2018-07-04 DIAGNOSIS — L270 Generalized skin eruption due to drugs and medicaments taken internally: Secondary | ICD-10-CM

## 2018-07-04 DIAGNOSIS — G40909 Epilepsy, unspecified, not intractable, without status epilepticus: Secondary | ICD-10-CM

## 2018-07-04 DIAGNOSIS — T368X5A Adverse effect of other systemic antibiotics, initial encounter: Secondary | ICD-10-CM | POA: Diagnosis not present

## 2018-07-04 DIAGNOSIS — R21 Rash and other nonspecific skin eruption: Secondary | ICD-10-CM | POA: Diagnosis not present

## 2018-07-04 DIAGNOSIS — L511 Stevens-Johnson syndrome: Secondary | ICD-10-CM

## 2018-07-04 LAB — CBC WITH DIFFERENTIAL/PLATELET
Abs Immature Granulocytes: 0.1 10*3/uL (ref 0.0–0.1)
Basophils Absolute: 0.1 10*3/uL (ref 0.0–0.1)
Basophils Relative: 0 %
EOS ABS: 0.9 10*3/uL — AB (ref 0.0–0.7)
EOS PCT: 6 %
HEMATOCRIT: 46.6 % — AB (ref 36.0–46.0)
Hemoglobin: 14.7 g/dL (ref 12.0–15.0)
Immature Granulocytes: 0 %
LYMPHS ABS: 2.7 10*3/uL (ref 0.7–4.0)
Lymphocytes Relative: 17 %
MCH: 29.8 pg (ref 26.0–34.0)
MCHC: 31.5 g/dL (ref 30.0–36.0)
MCV: 94.3 fL (ref 78.0–100.0)
MONO ABS: 1.2 10*3/uL — AB (ref 0.1–1.0)
MONOS PCT: 8 %
Neutro Abs: 11 10*3/uL — ABNORMAL HIGH (ref 1.7–7.7)
Neutrophils Relative %: 69 %
Platelets: 355 10*3/uL (ref 150–400)
RBC: 4.94 MIL/uL (ref 3.87–5.11)
RDW: 13.6 % (ref 11.5–15.5)
WBC: 16 10*3/uL — ABNORMAL HIGH (ref 4.0–10.5)

## 2018-07-04 LAB — COMPREHENSIVE METABOLIC PANEL
ALBUMIN: 2.9 g/dL — AB (ref 3.5–5.0)
ALK PHOS: 64 U/L (ref 38–126)
ALT: 15 U/L (ref 0–44)
ANION GAP: 13 (ref 5–15)
AST: 23 U/L (ref 15–41)
BUN: 12 mg/dL (ref 8–23)
CALCIUM: 9.1 mg/dL (ref 8.9–10.3)
CO2: 22 mmol/L (ref 22–32)
CREATININE: 0.7 mg/dL (ref 0.44–1.00)
Chloride: 105 mmol/L (ref 98–111)
GFR calc Af Amer: >60 mL/min (ref >60)
GFR calc non Af Amer: >60 mL/min (ref >60)
GLUCOSE: 122 mg/dL — AB (ref 70–99)
Potassium: 4.1 mmol/L (ref 3.5–5.1)
SODIUM: 140 mmol/L (ref 135–145)
Total Bilirubin: 0.6 mg/dL (ref 0.3–1.2)
Total Protein: 6.5 g/dL (ref 6.5–8.1)

## 2018-07-04 LAB — I-STAT CG4 LACTIC ACID, ED
Lactic Acid, Venous: 2.42 mmol/L (ref 0.5–1.9)
Lactic Acid, Venous: 1.05 mmol/L (ref 0.5–1.9)

## 2018-07-04 LAB — CSF CULTURE W GRAM STAIN: Culture: NO GROWTH

## 2018-07-04 LAB — ENTEROVIRUS PCR: Enterovirus PCR: NEGATIVE

## 2018-07-04 LAB — CSF CULTURE

## 2018-07-04 MED ORDER — PREDNISONE 20 MG PO TABS
60.0000 mg | ORAL_TABLET | Freq: Once | ORAL | Status: AC
  Administered 2018-07-04: 60 mg via ORAL
  Filled 2018-07-04: qty 3

## 2018-07-04 MED ORDER — HYDROXYZINE HCL 25 MG PO TABS: 25.0000 mg | ORAL_TABLET | Freq: Four times a day (QID) | ORAL | 0 refills | Status: DC | PRN

## 2018-07-04 MED ORDER — HYDROXYZINE HCL 25 MG PO TABS
50.0000 mg | ORAL_TABLET | Freq: Once | ORAL | Status: AC
  Administered 2018-07-04: 50 mg via ORAL
  Filled 2018-07-04: qty 2

## 2018-07-04 MED ORDER — PREDNISONE 10 MG PO TABS: 20.0000 mg | ORAL_TABLET | Freq: Every day | ORAL | 0 refills | Status: DC

## 2018-07-04 MED ORDER — HYDROXYZINE HCL 25 MG PO TABS: 25.0000 mg | ORAL_TABLET | ORAL | 0 refills | Status: DC | PRN

## 2018-07-04 NOTE — ED Provider Notes (Signed)
  MRN: [id number] DOB: 01/14/1946  Subjective:   Mallory Boone is a 72 y.o. female presenting for 2-day history of progressively worsening skin rash.  Symptoms started out as itching and has dramatically worsened to a rash that is erythematous and diffusely spread over her entire body sparing the oral area.  She is also started to develop blisters over her left arm near her IV site and face.  Patient was just discharged from hospitalization for seizures wherein she had received acyclovir, ceftriaxone, vancomycin.  Patient's husband reports that she has a baseline level of confusion but is unchanged.  She was just discharged from the hospital yesterday at about 2 PM.  No current facility-administered medications for this encounter.   Current Outpatient Medications:  .  alendronate (FOSAMAX) 70 MG tablet, Take 70 mg by mouth once a week. Take with a full glass of water on an empty stomach., Disp: , Rfl:  .  calcium-vitamin D (OSCAL WITH D) 500-200 MG-UNIT tablet, Take 1 tablet by mouth., Disp: , Rfl:  .  Lacosamide 100 MG TABS, Take 1 tablet twice a day, Disp: 90 tablet, Rfl: 3 .  levETIRAcetam (KEPPRA XR) 500 MG 24 hr tablet, Take 4 tablets every night, Disp: 360 tablet, Rfl: 3 .  Vilazodone HCl (VIIBRYD) 20 MG TABS, Take 20 mg by mouth daily. , Disp: , Rfl:    Allergies  Allergen Reactions  . Ampicillin Rash    Red maculopapular rash occurred in context of coadministration of ampicillin, vancomycin, and ceftriaxone    Past Medical History:  Diagnosis Date  . Depression   . Encephalitis   . Osteoporosis   . Seizures ([place])      Past Surgical History:  Procedure Laterality Date  . ABDOMINAL HYSTERECTOMY    . left lobe lobectomy    . SPLENECTOMY, TOTAL      Objective:   Vitals: BP 121/64   Pulse 95   Temp 98 F (36.7 C)   Resp 18   SpO2 100%   Physical Exam  Constitutional: She is oriented to person, place, and time. She appears well-developed and well-nourished.  HENT:    Mouth/Throat: Oropharynx is clear and moist.  Eyes: Pupils are equal, round, and reactive to light. EOM are normal.  Eyes are injected bilaterally.  Cardiovascular: Normal rate, regular rhythm, normal heart sounds and intact distal pulses. Exam reveals no gallop and no friction rub.  No murmur heard. Pulmonary/Chest: Effort normal and breath sounds normal. No stridor. No respiratory distress. She has no wheezes. She has no rales.  Neurological: She is alert and oriented to person, place, and time.  Skin: Skin is warm and dry. Rash noted.  Diffusely scattered maculopapular erythematous rash as depicted sparing the oral cavity.  Patient also has blisterlike lesions along the left side of her face and left forearm near the IV site.  Psychiatric: She has a normal mood and affect.          Assessment and Plan :   Rash  Stevens-Johnson syndrome (HCC)  Itching  Adverse effect of drug, initial encounter  Seizure disorder [facility])  Patient examined with Dr. Joseph Art and we are in agreement the patient needs emergent evaluation and manage meant for was likely Stevens-Johnson syndrome.  Counseled patient on risks of this severe drug reaction and they are in agreement to report to the ER immediately.     Jaynee Eagles, PA-C 07/04/18 1145

## 2018-07-04 NOTE — Discharge Instructions (Signed)
Please report to the ER immediately to have management of a severe drug reaction called Katherina Right Syndrome.

## 2018-07-04 NOTE — ED Provider Notes (Signed)
[facility] EMERGENCY DEPARTMENT Provider Note   CSN: [id number] Arrival date & time: 07/04/18  1145     History   Chief Complaint Chief Complaint  Patient presents with  . Rash    HPI Mallory Boone is a 72 y.o. female.  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 Husband reports the rash began around her neck and has sine spread to involve her entire body. Denies any painful rash. Reports significantly itchy. No eye, mouth, throat, urethra, rectal discomfort.  No longer taking abx but rash worsening. No fevers, vomiting or other concerns.   Past Medical History:  Diagnosis Date  . Depression   . Encephalitis   . Osteoporosis   . Seizures [facility])     Patient Active Problem List   Diagnosis Date Noted  . Encephalopathy acute 07/01/2018  . Encephalopathy 06/30/2018  . 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 Seizure ([place]) 06/30/2018  . Encephalomyopathy 06/30/2018    Past Surgical History:  Procedure Laterality Date  . ABDOMINAL HYSTERECTOMY    . left lobe lobectomy    . SPLENECTOMY, TOTAL       OB History   None      Home Medications    Prior to Admission medications   Medication Sig Start Date End Date Taking? Authorizing Provider  alendronate (FOSAMAX) 70 MG tablet Take 70 mg by mouth once a week. Take with a full glass of water on an empty stomach.   Yes [provider]  calcium-vitamin D (OSCAL WITH D) 500-200 MG-UNIT tablet Take 1 tablet by mouth.   Yes [provider]  Lacosamide 100 MG TABS Take 1 tablet twice a day 06/10/18  Yes Cameron Sprang, MD  levETIRAcetam (KEPPRA XR) 500 MG 24 hr tablet Take 4 tablets every night 06/10/18  Yes Cameron Sprang, MD    Multiple Vitamins-Minerals (MULTIVITAMIN WITH MINERALS) tablet Take 1 tablet by mouth daily.   Yes [provider]  Vilazodone HCl (VIIBRYD) 20 MG TABS Take 20 mg by mouth daily.    Yes [provider]  hydrOXYzine (ATARAX/VISTARIL) 25 MG tablet Take 1 tablet (25 mg total) by mouth every 6 (six) hours as needed for itching. 07/04/18   Gareth Morgan, MD  predniSONE (DELTASONE) 10 MG tablet Take 2 tablets (20 mg total) by mouth daily. Take 4 tablets for 4 days, 3 tablets for 3 days, 2 tablets for 3 days, 1 tablet for 4 days 07/04/18   Gareth Morgan, MD    Family History History reviewed. No pertinent family history.  Social History Social History   Tobacco Use  . Smoking status: Never Smoker  . Smokeless tobacco: Never Used  Substance Use Topics  . Alcohol use: Never    Frequency: Never  . Drug use: Never     Allergies   Ampicillin   Review of Systems Review of Systems  Constitutional: Negative for fever.  HENT: Negative for sore throat.   Eyes: Negative for visual disturbance.  Respiratory: Negative for cough and shortness of breath.   Cardiovascular: Negative for chest pain.  Gastrointestinal: Negative for abdominal pain and nausea.  Genitourinary: Negative for difficulty urinating.  Musculoskeletal: Negative for back pain and neck pain.  Skin: Positive for rash.  Neurological: Negative for syncope and headaches.  Physical Exam Updated Vital Signs BP 115/69   Pulse 79   Temp (!) 97.4 F (36.3 C) (Oral)   Resp 20   SpO2 92%   Physical Exam  Constitutional: She is oriented to person, place, and time. She appears well-developed and well-nourished. No distress.  HENT:  Head: Normocephalic and atraumatic.  Edema and erythema of ears Confluent erythema on scalp  Eyes: Conjunctivae and EOM are normal.  Neck: Normal range of motion.  Cardiovascular: Normal rate, regular rhythm, normal heart sounds and intact distal pulses. Exam reveals no  gallop and no friction rub.  No murmur heard. Pulmonary/Chest: Effort normal and breath sounds normal. No respiratory distress. She has no wheezes. She has no rales.  Abdominal: Soft. She exhibits no distension. There is no tenderness. There is no guarding.  Musculoskeletal: She exhibits no edema or tenderness.  Neurological: She is alert and oriented to person, place, and time.  Skin: Skin is warm and dry. Rash (diffuse rash, nonblanching over lower extremities, back maculopapular, small vesicle near prior EV site, question tiny vesicle) noted. She is not diaphoretic. No erythema.  Nursing note and vitals reviewed.    ED Treatments / Results  Labs (all labs ordered are listed, but only abnormal results are displayed) Labs Reviewed  COMPREHENSIVE METABOLIC PANEL - Abnormal; Notable for the following components:      Result Value   Glucose, Bld 122 (*)    Albumin 2.9 (*)    All other components within normal limits  CBC WITH DIFFERENTIAL/PLATELET - Abnormal; Notable for the following components:   WBC 16.0 (*)    HCT 46.6 (*)    Neutro Abs 11.0 (*)    Monocytes Absolute 1.2 (*)    Eosinophils Absolute 0.9 (*)    All other components within normal limits  I-STAT CG4 LACTIC ACID, ED - Abnormal; Notable for the following components:   Lactic Acid, Venous 2.42 (*)    All other components within normal limits  I-STAT CG4 LACTIC ACID, ED    EKG None  Radiology No results found.  Procedures Procedures (including critical care time)  Medications Ordered in ED Medications  hydrOXYzine (ATARAX/VISTARIL) tablet 50 mg (50 mg Oral Given 07/04/18 1538)  predniSONE (DELTASONE) tablet 60 mg (60 mg Oral Given 07/04/18 1613)     Initial Impression / Assessment and Plan / ED Course  I have reviewed the triage vital signs and the nursing notes.  Pertinent labs & imaging results that were available during my care of the patient were reviewed by me and considered in my medical decision  making (see chart for details).     72yo female with hx of HSV encephalitis in 1996, partial simple seizures , dementia, depression, recent hospitalization for seizure, fever with empiric abx including vancomycin, ceftriaxone, acyclovir, ampicillin who had rash begin prior to discharge presents from urgent care with concern for worsening rash.  Sent with concern for SJS, however have low suspicion for this as pt with no mucous membraine involvement, and no painful rash. Pt afebrile, not consistent with menigitis/RMSF.   Called Dermatology at [facility], Dr. Clovis Riley and discussed presentation.  Agree that it is not consistent with SJS, suspect other allergic drug reaction and recommend prednisone.    Given rx for prednisone and atarax. Rec outpt dermatology follow up. Discussed reasons to return in detail.   Final Clinical Impressions(s) / ED Diagnoses   Final diagnoses:  Adverse effect of drug, initial encounter  Drug rash    ED Discharge Orders  Ordered    predniSONE (DELTASONE) 10 MG tablet  Daily     07/04/18 1650    hydrOXYzine (ATARAX/VISTARIL) 25 MG tablet  Every 4 hours PRN,   Status:  Discontinued     07/04/18 1650    hydrOXYzine (ATARAX/VISTARIL) 25 MG tablet  Every 6 hours PRN     07/04/18 1651           Gareth Morgan, MD 07/05/18 0404

## 2018-07-04 NOTE — ED Notes (Addendum)
Discharge instructions and medications discussed with Pt and husband. Pt verbalized understanding. Pt stable and ambulatory.

## 2018-07-04 NOTE — ED Triage Notes (Signed)
Pt in from urgent care, c/o generalized rash with blistering to her arms, pt was admitted a few days ago and received vanc and ampicillin, developed rash immediately after administration and was discharged with rash but it was not itching at the time- this morning developed itching and blistering to her arms

## 2018-07-04 NOTE — ED Notes (Signed)
Called [facility] per Dr. Billy Fischer for consult with dermatology

## 2018-07-04 NOTE — ED Triage Notes (Signed)
Pt husband states she took some ampicillin and had a rash break out for the last couple days all over her body. Was seen yesterday but not given meds since shes on so many already, but pt states its worse, itchy, and has spread.

## 2018-07-05 LAB — CULTURE, BLOOD (ROUTINE X 2)
Culture: NO GROWTH
Special Requests: ADEQUATE

## 2018-07-07 ENCOUNTER — Encounter: Payer: Self-pay | Admitting: Neurology

## 2018-07-07 ENCOUNTER — Ambulatory Visit (INDEPENDENT_AMBULATORY_CARE_PROVIDER_SITE_OTHER): Payer: Medicare Other | Admitting: Neurology

## 2018-07-07 VITALS — BP 120/74 | HR 68 | Ht 66.0 in | Wt 210.0 lb

## 2018-07-07 DIAGNOSIS — G40219 Localization-related (focal) (partial) symptomatic epilepsy and epileptic syndromes with complex partial seizures, intractable, without status epilepticus: Secondary | ICD-10-CM | POA: Diagnosis not present

## 2018-07-07 MED ORDER — LACOSAMIDE 200 MG PO TABS: 200.0000 mg | ORAL_TABLET | Freq: Two times a day (BID) | ORAL | 3 refills | Status: DC

## 2018-07-07 MED ORDER — LORAZEPAM 1 MG PO TABS: ORAL_TABLET | ORAL | 5 refills | Status: DC

## 2018-07-07 NOTE — Patient Instructions (Signed)
1. Increase Vimpat to 200mg  twice a day. With your current prescription of Vimpat 100mg , take 2 tablets twice a day. If too dizzy, you can slowly increase to 1.5 tablets twice a day for a week, then increase to 2 tablets twice a day. Once done, your new bottle will be for Vimpat 200mg , take 1 tablet twice a day  2. Continue Keppra XR 2000mg  daily  3. For back to back seizures, administer 1 tablet lorazepam 1mg  as needed.   4. Follow-up with Dermatology as planned  5. Follow-up as scheduled in March 2020, call for any changes  Seizure Precautions: 1. If medication has been prescribed for you to prevent seizures, take it exactly as directed.  Do not stop taking the medicine without talking to your doctor first, even if you have not had a seizure in a long time.   2. Avoid activities in which a seizure would cause danger to yourself or to others.  Don't operate dangerous machinery, swim alone, or climb in high or dangerous places, such as on ladders, roofs, or girders.  Do not drive unless your doctor says you may.  3. If you have any warning that you may have a seizure, lay down in a safe place where you can't hurt yourself.    4.  No driving for 6 months from last seizure, as per [facility].   Please refer to the following link on the [place] website for more information: http://www.epilepsyfoundation.org/answerplace/Social/driving/drivingu.cfm   5.  Maintain good sleep hygiene. Avoid alcohol.  6.  Contact your doctor if you have any problems that may be related to the medicine you are taking.  7.  Call 911 and bring the patient back to the ED if:        A.  The seizure lasts longer than 5 minutes.       B.  The patient doesn't awaken shortly after the seizure  C.  The patient has new problems such as difficulty seeing, speaking or moving  D.  The patient was injured during the seizure  E.  The patient has a temperature over 102 F (39C)  F.  The  patient vomited and now is having trouble breathing

## 2018-07-07 NOTE — Progress Notes (Signed)
NEUROLOGY FOLLOW UP OFFICE NOTE  Mallory Boone [id number] 08/09/1946  HISTORY OF PRESENT ILLNESS: I had the pleasure of seeing Mallory Boone in follow-up in the neurology clinic on 07/07/2018.  The patient was last seen 3 weeks ago to establish care for seizures. She is again accompanied by her husband who helps supplement the history today. She presents for an urgent follow-up after recent hospitalization for increased seizures. She had 5 or 6 of her typical seizures on 06/30/18 but they were becoming longer in duration with longer confusion in between. He decided to bring her to the hospital, and as they arrived, she started having left arm slow twitching and slumped down. She was noted to have right gaze preference and left-sided weakness. While she was in the ER, she spiked a fever of 101.7. She had a lumbar puncture which was normal. She was empirically treated with antibiotics, her husband reports that soon after infusion, she developed a diffuse rash. 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 She had an EEG showing sharp transients in sleep in the right frontal region with phase reversal at F8. She was confused and confabulating in the hospital. No medication changes were made, she has been back to baseline for the past few days. She was in the ER 2 more times for rash, which is slowly improving with prednisone. No mucosal involvement. The rash is non-pruritic, involving her trunk, abdomen, back, arms, and legs. She had one typical seizure while in the hospital, another on Saturday, and most recently last night.   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 She is amnestic of her seizures with no prior warning symptoms. Her husband describes stereotyped episodes where she would start rocking and grimacing, perseverating repeatedly saying "I'm okay, it's okay" for 10 seconds. She would be disoriented after, saying "I'm hot, I'm cold, I need to use the bathroom, where is it?" Her husband feels they occur more when she is slowing down or when it is quiet (such as in church). He has not seen any nocturnal seizures. She has only had 2 convulsions when she was initially diagnosed with encephalitis in 1996, none since then. He recalls her trying Dilantin and Lamictal in the past. She has been on Keppra XR 2000mg  daily for at least 10 years, Vimpat 100mg  BID was added on 18 months ago. She was having 2-3 seizures a day, and had a significant reduction with addition of Vimpat. Over the past 6 months, her husband has noticed she would sometimes do a little pedaling of both feet. Her husband reports an average of 2 seizures a week, last seizure was 2 days ago. No associated tongue bite or incontinence. No side effects on medications. She denies any olfactory/gustatory hallucinations, deja vu, rising epigastric sensation, focal numbness/tingling/weakness, myoclonic jerks. She denies any headaches, dizziness, diplopia, dysarthria/dysphagia, neck/back pain, bowel dysfunction. She has urinary frequency. Her hands feels stiff sometimes. She has had cognitive issues since the encephalitis, she does not drive. Her husband manages finances. She has to write things down in a journal daily. She manages her own medications. She is independent with dressing and bathing. They moved to [facility] 3  months ago to be closer to family. She said they moved a year ago. Her mother had Alzheimer's disease, her father had Lewy Body dementia.  No prior EEGs or MRI available for review. Her husband reports bilateral temporal damage, with more necrosis on the  left side. Per notes, she had an abnormal EEG in 2016, report unavailable for review.  Epilepsy Risk Factors:  Encephalitis in 1996. Otherwise she had a normal birth and early development.  There is no history of febrile convulsions, significant traumatic brain injury, neurosurgical procedures, or family history of seizures.  Prior AEDs: Dilantin, lamictal  PAST MEDICAL HISTORY: Past Medical History:  Diagnosis Date  . Depression   . Encephalitis   . Osteoporosis   . Seizures ([place])     MEDICATIONS: Current Outpatient Medications on File Prior to Visit  Medication Sig Dispense Refill  . alendronate (FOSAMAX) 70 MG tablet Take 70 mg by mouth once a week. Take with a full glass of water on an empty stomach.    . calcium-vitamin D (OSCAL WITH D) 500-200 MG-UNIT tablet Take 1 tablet by mouth.    . hydrOXYzine (ATARAX/VISTARIL) 25 MG tablet Take 1 tablet (25 mg total) by mouth every 6 (six) hours as needed for itching. 30 tablet 0  . Lacosamide 100 MG TABS Take 1 tablet twice a day 90 tablet 3  . levETIRAcetam (KEPPRA XR) 500 MG 24 hr tablet Take 4 tablets every night 360 tablet 3  . Multiple Vitamins-Minerals (MULTIVITAMIN WITH MINERALS) tablet Take 1 tablet by mouth daily.    . predniSONE (DELTASONE) 10 MG tablet Take 2 tablets (20 mg total) by mouth daily. Take 4 tablets for 4 days, 3 tablets for 3 days, 2 tablets for 3 days, 1 tablet for 4 days 35 tablet 0  . Vilazodone HCl (VIIBRYD) 20 MG TABS Take 20 mg by mouth daily.      No current facility-administered medications on file prior to visit.     ALLERGIES: Allergies  Allergen Reactions  . Ampicillin Rash    Red maculopapular rash occurred in context of coadministration of ampicillin, vancomycin, and ceftriaxone    FAMILY HISTORY: No family history on file.  SOCIAL HISTORY: Social History   Socioeconomic History  . Marital status: Married    Spouse name: Not on file  . Number of children: Not on file  . Years of  education: Not on file  . Highest education level: Not on file  Occupational History  . Not on file  Social Needs  . Financial resource strain: Not on file  . Food insecurity:    Worry: Not on file    Inability: Not on file  . Transportation needs:    Medical: Not on file    Non-medical: Not on file  Tobacco Use  . Smoking status: Never Smoker  . Smokeless tobacco: Never Used  Substance and Sexual Activity  . Alcohol use: Never    Frequency: Never  . Drug use: Never  . Sexual activity: Yes    Birth control/protection: Post-menopausal  Lifestyle  . Physical activity:    Days per week: Not on file    Minutes per session: Not on file  . Stress: Not on file  Relationships  . Social connections:    Talks on phone: Not on file    Gets together: Not on file    Attends religious service: Not on file    Active member of club or organization: Not on file    Attends meetings  of clubs or organizations: Not on file    Relationship status: Not on file  . Intimate partner violence:    Fear of current or ex partner: Not on file    Emotionally abused: Not on file    Physically abused: Not on file    Forced sexual activity: Not on file  Other Topics Concern  . Not on file  Social History Narrative  . Not on file    REVIEW OF SYSTEMS: Constitutional: No fevers, chills, or sweats, no generalized fatigue, change in appetite Eyes: No visual changes, double vision, eye pain Ear, nose and throat: No hearing loss, ear pain, nasal congestion, sore throat Cardiovascular: No chest pain, palpitations Respiratory:  No shortness of breath at rest or with exertion, wheezes GastrointestinaI: No nausea, vomiting, diarrhea, abdominal pain, fecal incontinence Genitourinary:  No dysuria, urinary retention or frequency Musculoskeletal:  No neck pain, back pain Integumentary: + rash, pruritus Neurological: as above Psychiatric: No depression, insomnia, anxiety Endocrine: No palpitations, fatigue,  diaphoresis, mood swings, change in appetite, change in weight, increased thirst Hematologic/Lymphatic:  No anemia, purpura, petechiae. Allergic/Immunologic: no itchy/runny eyes, nasal congestion, recent allergic reactions, rashes  PHYSICAL EXAM: Vitals:   07/07/18 1141  BP: 120/74  Pulse: 68  SpO2: 96%   General: No acute distress, in good spirits Head:  Normocephalic/atraumatic Neck: supple, no paraspinal tenderness, full range of motion Heart:  Regular rate and rhythm Lungs:  Clear to auscultation bilaterally Back: No paraspinal tenderness Skin/Extremities: +diffuse rash, no edema Neurological Exam: alert and oriented to person, place, and time. No aphasia or dysarthria. Fund of knowledge is appropriate.  Recent and remote memory are impaired, 0/3 delayed recall.  Attention and concentration are reduced, difficulty spelling WORLD backward ("DRLOW"). Difficulty naming "button," difficulty with repetition. Cranial nerves: Pupils equal, round, reactive to light. Extraocular movements intact with no nystagmus. Visual fields full. Facial sensation intact. No facial asymmetry. Tongue, uvula, palate midline.  Motor: Bulk and tone normal, muscle strength 5/5 throughout with no pronator drift.  Sensation to light touch intact.  No extinction to double simultaneous stimulation.  Deep tendon reflexes 2+ throughout, toes downgoing.  Finger to nose testing intact.  Gait narrow-based and steady, able to tandem walk adequately.  Romberg negative.  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 She was recently admitted for an increase in seizure frequency in the setting of a febrile illness. She has not clonic activity in many years, but last 06/30/18 after a cluster of focal seizures with typical rocking and vocalization, she had an episode of slumping down with left arm clonic  activity. 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 We discussed increasing Vimpat to 200mg  BID, continue Keppra XR 500mg  4 tabs qhs. Her husband was given instructions for rescue lorazepam 1mg  as needed for seizure clusters. She does not drive. She is now back to baseline except for diffuse drug rash that appears to be improving, follow-up with Dermatology as planned. Follow-up in 6 months as scheduled, they know to call for any changes.   Thank you for allowing me to participate in her care.  Please do not hesitate to call for any questions or concerns.  The duration of this appointment visit was 30 minutes of face-to-face time with the patient.  Greater than 50% of this time was spent in  counseling, explanation of diagnosis, planning of further management, and coordination of care.   Ellouise Newer, M.D.   CC: [place]

## 2018-07-08 DIAGNOSIS — M6281 Muscle weakness (generalized): Secondary | ICD-10-CM | POA: Diagnosis not present

## 2018-07-08 DIAGNOSIS — F329 Major depressive disorder, single episode, unspecified: Secondary | ICD-10-CM | POA: Diagnosis not present

## 2018-07-08 DIAGNOSIS — G4089 Other seizures: Secondary | ICD-10-CM | POA: Diagnosis not present

## 2018-07-10 ENCOUNTER — Telehealth: Payer: Self-pay | Admitting: Neurology

## 2018-07-10 DIAGNOSIS — F329 Major depressive disorder, single episode, unspecified: Secondary | ICD-10-CM | POA: Diagnosis not present

## 2018-07-10 DIAGNOSIS — M6281 Muscle weakness (generalized): Secondary | ICD-10-CM | POA: Diagnosis not present

## 2018-07-10 DIAGNOSIS — G4089 Other seizures: Secondary | ICD-10-CM | POA: Diagnosis not present

## 2018-07-10 NOTE — Telephone Encounter (Signed)
Noted.  Forwarded to Dr. Delice Lesch as Juluis Rainier

## 2018-07-10 NOTE — Telephone Encounter (Signed)
Remo Lipps (OT) with [facility]. She is calling to let Dr. Delice Lesch know that this patient has declined OT. Thanks

## 2018-07-11 DIAGNOSIS — L270 Generalized skin eruption due to drugs and medicaments taken internally: Secondary | ICD-10-CM | POA: Diagnosis not present

## 2018-07-11 NOTE — Telephone Encounter (Signed)
We didn't.  I see that now.

## 2018-07-11 NOTE — Telephone Encounter (Signed)
I didn't think we referred her for home health?

## 2018-07-14 ENCOUNTER — Telehealth: Payer: Self-pay | Admitting: Neurology

## 2018-07-14 MED ORDER — LEVETIRACETAM ER 500 MG PO TB24: ORAL_TABLET | ORAL | 3 refills | Status: DC

## 2018-07-14 NOTE — Telephone Encounter (Signed)
Patient's husband called regarding her Keppra  XR 500 MG. [facility] never received it. They are needing a 901 day supply. Please Call. Thanks

## 2018-07-14 NOTE — Telephone Encounter (Signed)
Rx sent to [facility].  #[phone] with 3 refills.

## 2018-07-15 DIAGNOSIS — F329 Major depressive disorder, single episode, unspecified: Secondary | ICD-10-CM | POA: Diagnosis not present

## 2018-07-15 DIAGNOSIS — G4089 Other seizures: Secondary | ICD-10-CM | POA: Diagnosis not present

## 2018-07-15 DIAGNOSIS — M6281 Muscle weakness (generalized): Secondary | ICD-10-CM | POA: Diagnosis not present

## 2018-07-17 DIAGNOSIS — M6281 Muscle weakness (generalized): Secondary | ICD-10-CM | POA: Diagnosis not present

## 2018-07-17 DIAGNOSIS — G4089 Other seizures: Secondary | ICD-10-CM | POA: Diagnosis not present

## 2018-07-17 DIAGNOSIS — F329 Major depressive disorder, single episode, unspecified: Secondary | ICD-10-CM | POA: Diagnosis not present

## 2018-07-29 DIAGNOSIS — G4089 Other seizures: Secondary | ICD-10-CM | POA: Diagnosis not present

## 2018-07-29 DIAGNOSIS — F329 Major depressive disorder, single episode, unspecified: Secondary | ICD-10-CM | POA: Diagnosis not present

## 2018-07-29 DIAGNOSIS — M6281 Muscle weakness (generalized): Secondary | ICD-10-CM | POA: Diagnosis not present

## 2018-08-08 DIAGNOSIS — H25012 Cortical age-related cataract, left eye: Secondary | ICD-10-CM | POA: Diagnosis not present

## 2018-08-08 DIAGNOSIS — H2512 Age-related nuclear cataract, left eye: Secondary | ICD-10-CM | POA: Diagnosis not present

## 2018-08-08 DIAGNOSIS — H2511 Age-related nuclear cataract, right eye: Secondary | ICD-10-CM | POA: Diagnosis not present

## 2018-08-22 DIAGNOSIS — H2511 Age-related nuclear cataract, right eye: Secondary | ICD-10-CM | POA: Diagnosis not present

## 2018-08-22 DIAGNOSIS — H25011 Cortical age-related cataract, right eye: Secondary | ICD-10-CM | POA: Diagnosis not present

## 2018-08-29 ENCOUNTER — Ambulatory Visit: Payer: TRICARE For Life (TFL) | Admitting: Family

## 2018-10-09 DIAGNOSIS — Z6835 Body mass index (BMI) 35.0-35.9, adult: Secondary | ICD-10-CM | POA: Diagnosis not present

## 2018-10-09 DIAGNOSIS — G0490 Encephalitis and encephalomyelitis, unspecified: Secondary | ICD-10-CM | POA: Diagnosis not present

## 2018-10-09 DIAGNOSIS — M810 Age-related osteoporosis without current pathological fracture: Secondary | ICD-10-CM | POA: Diagnosis not present

## 2018-10-09 DIAGNOSIS — I69998 Other sequelae following unspecified cerebrovascular disease: Secondary | ICD-10-CM | POA: Diagnosis not present

## 2018-10-09 DIAGNOSIS — R569 Unspecified convulsions: Secondary | ICD-10-CM | POA: Diagnosis not present

## 2018-10-09 DIAGNOSIS — Z1389 Encounter for screening for other disorder: Secondary | ICD-10-CM | POA: Diagnosis not present

## 2018-10-09 DIAGNOSIS — Z85118 Personal history of other malignant neoplasm of bronchus and lung: Secondary | ICD-10-CM | POA: Diagnosis not present

## 2018-10-09 DIAGNOSIS — F3289 Other specified depressive episodes: Secondary | ICD-10-CM | POA: Diagnosis not present

## 2018-10-09 DIAGNOSIS — R413 Other amnesia: Secondary | ICD-10-CM | POA: Diagnosis not present

## 2018-10-09 DIAGNOSIS — Z23 Encounter for immunization: Secondary | ICD-10-CM | POA: Diagnosis not present

## 2018-10-16 ENCOUNTER — Other Ambulatory Visit: Payer: Self-pay | Admitting: Internal Medicine

## 2018-10-17 ENCOUNTER — Other Ambulatory Visit: Payer: Self-pay | Admitting: Internal Medicine

## 2018-10-17 DIAGNOSIS — Z1231 Encounter for screening mammogram for malignant neoplasm of breast: Secondary | ICD-10-CM

## 2018-12-09 ENCOUNTER — Other Ambulatory Visit: Payer: Self-pay

## 2018-12-09 ENCOUNTER — Encounter: Payer: Self-pay | Admitting: Neurology

## 2018-12-09 ENCOUNTER — Ambulatory Visit (INDEPENDENT_AMBULATORY_CARE_PROVIDER_SITE_OTHER): Payer: Medicare Other | Admitting: Neurology

## 2018-12-09 VITALS — BP 110/72 | HR 68 | Ht 66.0 in | Wt 208.0 lb

## 2018-12-09 DIAGNOSIS — G40219 Localization-related (focal) (partial) symptomatic epilepsy and epileptic syndromes with complex partial seizures, intractable, without status epilepticus: Secondary | ICD-10-CM | POA: Diagnosis not present

## 2018-12-09 DIAGNOSIS — Z8661 Personal history of infections of the central nervous system: Secondary | ICD-10-CM

## 2018-12-09 MED ORDER — LEVETIRACETAM ER 500 MG PO TB24: ORAL_TABLET | ORAL | 3 refills | Status: DC

## 2018-12-09 MED ORDER — LACOSAMIDE 200 MG PO TABS: 200.0000 mg | ORAL_TABLET | Freq: Two times a day (BID) | ORAL | 3 refills | Status: DC

## 2018-12-09 NOTE — Patient Instructions (Signed)
Great seeing you! Continue Vimpat 200mg  twice a day and Keppra XR 500mg  4 tablets every night. Follow-up in 6 months, call for any changes.  Seizure Precautions: 1. If medication has been prescribed for you to prevent seizures, take it exactly as directed.  Do not stop taking the medicine without talking to your doctor first, even if you have not had a seizure in a long time.   2. Avoid activities in which a seizure would cause danger to yourself or to others.  Don't operate dangerous machinery, swim alone, or climb in high or dangerous places, such as on ladders, roofs, or girders.  Do not drive unless your doctor says you may.  3. If you have any warning that you may have a seizure, lay down in a safe place where you can't hurt yourself.    4.  No driving for 6 months from last seizure, as per [facility].   Please refer to the following link on the [place] website for more information: http://www.epilepsyfoundation.org/answerplace/Social/driving/drivingu.cfm   5.  Maintain good sleep hygiene. Avoid alcohol.  6.  Contact your doctor if you have any problems that may be related to the medicine you are taking.  7.  Call 911 and bring the patient back to the ED if:        A.  The seizure lasts longer than 5 minutes.       B.  The patient doesn't awaken shortly after the seizure  C.  The patient has new problems such as difficulty seeing, speaking or moving  D.  The patient was injured during the seizure  E.  The patient has a temperature over 102 F (39C)  F.  The patient vomited and now is having trouble breathing

## 2018-12-09 NOTE — Progress Notes (Signed)
NEUROLOGY FOLLOW UP OFFICE NOTE  Mallory Boone [id number] [birth year]  HISTORY OF PRESENT ILLNESS: I had the pleasure of seeing Mallory Boone in follow-up in the neurology clinic on 12/09/2018.  The patient was last seen 6 months ago for seizures. She is again accompanied by her husband who helps supplement the history today. She had 5 or 6 of her typical seizures in September 2019 that were becoming longer in duration with longer confusion in between. She had left arm twitching and loss of consciousness, noted to have right gaze preference and left-sided weakness. She continued to have daily brief seizures until dose of Vimpat increased to 200mg  BID on last visit, she is also on Keppra XR 500mg  4 tabs qhs. They are happy to report that seizures have significantly decreased in frequency and intensity. She has had maybe 3 brief seizures since her last visit, once every 3-4 weeks, last seizure was a month ago. Her husband has noticed some cognitive decline since September, she used to be good in spelling even after her bout of encephalitis, but recently would spell phonetically. She would call something a different word, such as a watermelon a raspberry. She manages her own medications and is religious with it. She does not drive. She denies any headaches, dizziness, vision changes, focal numbness/tingling/weakness, no falls. No side effects on medications.   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 She is amnestic of her seizures with no prior warning symptoms. Her husband describes stereotyped episodes where she would start rocking and grimacing, perseverating repeatedly saying "I'm okay, it's okay" for 10 seconds. She would be disoriented after, saying "I'm hot, I'm cold, I need to use the bathroom, where is it?" Her husband feels they occur more  when she is slowing down or when it is quiet (such as in church). He has not seen any nocturnal seizures. She has only had 2 convulsions when she was initially diagnosed with encephalitis in 1996, none since then. He recalls her trying Dilantin and Lamictal in the past. She has been on Keppra XR 2000mg  daily for at least 10 years, Vimpat 100mg  BID was added on 18 months ago. She was having 2-3 seizures a day, and had a significant reduction with addition of Vimpat. Over the past 6 months, her husband has noticed she would sometimes do a little pedaling of both feet. Her husband reports an average of 2 seizures a week, last seizure was 2 days ago. No associated tongue bite or incontinence. No side effects on medications. She denies any olfactory/gustatory hallucinations, deja vu, rising epigastric sensation, focal numbness/tingling/weakness, myoclonic jerks. She denies any headaches, dizziness, diplopia, dysarthria/dysphagia, neck/back pain, bowel dysfunction. She has urinary frequency. Her hands feels stiff sometimes. She has had cognitive issues since the encephalitis, she does not drive. Her husband manages finances. She has to write things down in a journal daily. She manages her own medications. She is independent with dressing and bathing. They moved to [place] 3 months ago to be closer to family. She said they moved a year ago. Her mother had Alzheimer's disease, her father had Lewy Body dementia.  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  EEG in 06/2018 showed sharp transients in sleep in the right frontal region with phase reversal at F8.   Epilepsy Risk Factors:  Encephalitis in 1996. Otherwise she had a normal birth and early development.  There is no history of  febrile convulsions, significant traumatic brain injury, neurosurgical procedures, or family history of seizures.  Prior AEDs: Dilantin, lamictal  PAST MEDICAL HISTORY: Past Medical History:  Diagnosis Date  . Depression   . Encephalitis   . Osteoporosis   . Seizures ([place])     MEDICATIONS: Current Outpatient Medications on File Prior to Visit  Medication Sig Dispense Refill  . alendronate (FOSAMAX) 70 MG tablet Take 70 mg by mouth once a week. Take with a full glass of water on an empty stomach.    . calcium-vitamin D (OSCAL WITH D) 500-200 MG-UNIT tablet Take 1 tablet by mouth.    . hydrOXYzine (ATARAX/VISTARIL) 25 MG tablet Take 1 tablet (25 mg total) by mouth every 6 (six) hours as needed for itching. 30 tablet 0  . lacosamide (VIMPAT) 200 MG TABS tablet Take 1 tablet (200 mg total) by mouth 2 (two) times daily. 180 tablet 3  . levETIRAcetam (KEPPRA XR) 500 MG 24 hr tablet Take 4 tablets every night 360 tablet 3  . LORazepam (ATIVAN) 1 MG tablet Take 1 tablet as needed for cluster of seizures. 10 tablet 5  . Multiple Vitamins-Minerals (MULTIVITAMIN WITH MINERALS) tablet Take 1 tablet by mouth daily.    . Vilazodone HCl (VIIBRYD) 20 MG TABS Take 20 mg by mouth daily.      No current facility-administered medications on file prior to visit.     ALLERGIES: Allergies  Allergen Reactions  . Ampicillin Rash    Red maculopapular rash occurred in context of coadministration of ampicillin, vancomycin, and ceftriaxone    FAMILY HISTORY: No family history on file.  SOCIAL HISTORY: Social History   Socioeconomic History  . Marital status: Married    Spouse name: Not on file  . Number of children: Not on file  . Years of education: Not on file  . Highest education level: Not on file  Occupational History  . Not on file  Social Needs  . Financial resource strain: Not on file  . Food insecurity:    Worry: Not on file    Inability: Not on file  . Transportation needs:     Medical: Not on file    Non-medical: Not on file  Tobacco Use  . Smoking status: Never Smoker  . Smokeless tobacco: Never Used  Substance and Sexual Activity  . Alcohol use: Never    Frequency: Never  . Drug use: Never  . Sexual activity: Yes    Birth control/protection: Post-menopausal  Lifestyle  . Physical activity:    Days per week: Not on file    Minutes per session: Not on file  . Stress: Not on file  Relationships  . Social connections:    Talks on phone: Not on file    Gets together: Not on file    Attends religious service: Not on file    Active member of club or organization: Not on file    Attends meetings of clubs or organizations: Not on file    Relationship status: Not on file  . Intimate partner violence:    Fear of current or ex partner: Not on file    Emotionally abused: Not on file    Physically abused: Not on file    Forced sexual activity:  Not on file  Other Topics Concern  . Not on file  Social History Narrative  . Not on file    REVIEW OF SYSTEMS: Constitutional: No fevers, chills, or sweats, no generalized fatigue, change in appetite Eyes: No visual changes, double vision, eye pain Ear, nose and throat: No hearing loss, ear pain, nasal congestion, sore throat Cardiovascular: No chest pain, palpitations Respiratory:  No shortness of breath at rest or with exertion, wheezes GastrointestinaI: No nausea, vomiting, diarrhea, abdominal pain, fecal incontinence Genitourinary:  No dysuria, urinary retention or frequency Musculoskeletal:  No neck pain, back pain Integumentary: + rash, pruritus Neurological: as above Psychiatric: No depression, insomnia, anxiety Endocrine: No palpitations, fatigue, diaphoresis, mood swings, change in appetite, change in weight, increased thirst Hematologic/Lymphatic:  No anemia, purpura, petechiae. Allergic/Immunologic: no itchy/runny eyes, nasal congestion, recent allergic reactions, rashes  PHYSICAL EXAM: Vitals:    12/09/18 1434  BP: 110/72  Pulse: 68  SpO2: 96%   General: No acute distress Head:  Normocephalic/atraumatic Neck: supple, no paraspinal tenderness, full range of motion Heart:  Regular rate and rhythm Lungs:  Clear to auscultation bilaterally Back: No paraspinal tenderness Skin/Extremities: no rash, no edema Neurological Exam: alert and oriented to person, place, day of week. States it is July 2021, summer (it is Feb 2020, winter). Mild expressive aphasia, mild dysarthria. Fund of knowledge is appropriate.  Recent and remote memory are impaired, 0/3 delayed recall.  Attention and concentration are reduced, difficulty spelling WORLD backward. Able to repeat and name. CDT 4/5 MMSE - Mini Mental State Exam 12/09/2018 06/10/2018  Orientation to time 2 1  Orientation to Place 3 3  Registration 3 3  Attention/ Calculation 2 5  Recall 0 0  Language- name 2 objects 2 1  Language- repeat 1 1  Language- follow 3 step command 3 3  Language- read & follow direction 1 1  Write a sentence 1 1  Copy design 1 1  Total score 19 20    Cranial nerves: Pupils equal, round, reactive to light. Extraocular movements intact with no nystagmus. Visual fields full. Facial sensation intact. No facial asymmetry. Tongue, uvula, palate midline.  Motor: Bulk and tone normal, muscle strength 5/5 throughout with no pronator drift.  Sensation to light touch intact.  No extinction to double simultaneous stimulation.  Finger to nose testing intact.  Gait narrow-based and steady, able to tandem walk adequately.  Romberg negative.  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 She had a cluster of seizures in September 2019 in the setting of febrile illness. 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 She is  doing better on higher dose Vimpat 200mg  BID and Keppra XR 500mg  4 tabs qhs, refills sent. Husband reports more difficulty spelling which is new for her, MMSE today 19/30. They decline speech therapy. Continue to monitor. She does not drive. Follow-up in 6 months, they know to call for any changes.   Thank you for allowing me to participate in her care.  Please do not hesitate to call for any questions or concerns.  The duration of this appointment visit was 30 minutes of face-to-face time with the patient.  Greater than 50% of this time was spent in counseling, explanation of diagnosis, planning of further management, and coordination of care.   Ellouise Newer, M.D.  CC: [employer]

## 2018-12-10 IMAGING — CT CT ANGIO NECK
1 of 9 series · 2 of 33 positions shown · IV contrast (ISOVUE)
Comparison: Head CT 06/30/2018

CLINICAL DATA: Seizure

EXAM:
CT ANGIOGRAPHY HEAD AND NECK
TECHNIQUE: Multidetector CT imaging of the head and neck was performed using
the standard protocol during bolus administration of intravenous
contrast. Multiplanar CT image reconstructions and MIPs were
obtained to evaluate the vascular anatomy. Carotid stenosis
measurements (when applicable) are obtained utilizing NASCET
criteria, using the distal internal carotid diameter as the
denominator.
CONTRAST:  100mL [id number] IOPAMIDOL ([id number]) INJECTION 76%

[Series 7: ax thin · axial · 0.30mm/px · z∈[+1384,+1494]mm · 2 of 332 slices shown]
[im 111/332  soft-tissue]
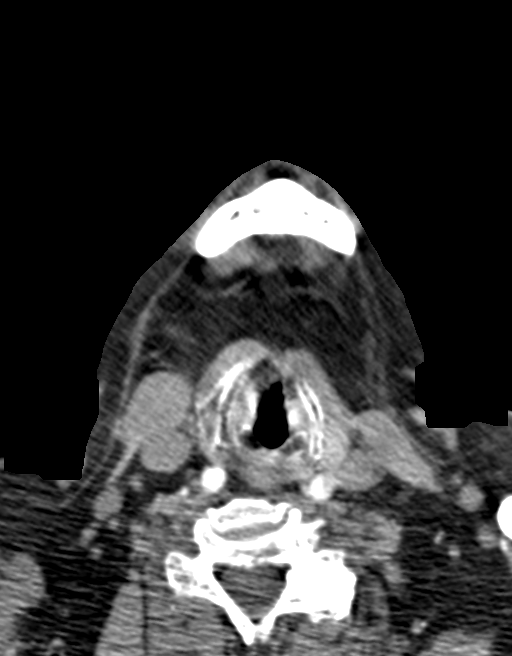
[im 221/332  bone]
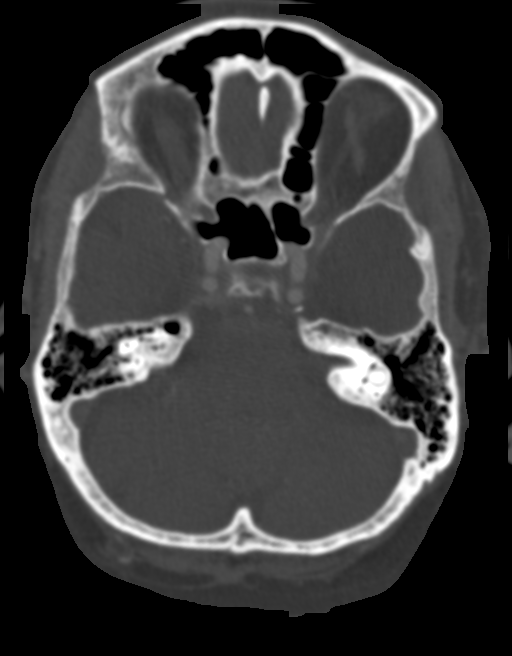

[2 of 33 positions shown; findings below may reference images not displayed]

FINDINGS: CTA NECK FINDINGS

AORTIC ARCH: There is no calcific atherosclerosis of the aortic
arch. There is no aneurysm, dissection or hemodynamically
significant stenosis of the visualized ascending aorta and aortic
arch. Conventional 3 vessel aortic branching pattern. The visualized
proximal subclavian arteries are widely patent.

RIGHT CAROTID SYSTEM:

--Common carotid artery: Widely patent origin without common carotid
artery dissection or aneurysm.

--Internal carotid artery: No dissection, occlusion or aneurysm.
Mild atherosclerotic calcification at the carotid bifurcation
without hemodynamically significant stenosis.

--External carotid artery: No acute abnormality.

LEFT CAROTID SYSTEM:

--Common carotid artery: Widely patent origin without common carotid
artery dissection or aneurysm.

--Internal carotid artery:No dissection, occlusion or aneurysm. Mild
atherosclerotic calcification at the carotid bifurcation without
hemodynamically significant stenosis.

--External carotid artery: No acute abnormality.

VERTEBRAL ARTERIES: Codominant configuration. Both origins are
normal. No dissection, occlusion or flow-limiting stenosis to the
vertebrobasilar confluence.

SKELETON: There is no bony spinal canal stenosis. No lytic or
blastic lesion.

OTHER NECK: Normal pharynx, larynx and major salivary glands. No
cervical lymphadenopathy. Unremarkable thyroid gland.

UPPER CHEST: No pneumothorax or pleural effusion. No nodules or
masses.

CTA HEAD FINDINGS

ANTERIOR CIRCULATION:

--Intracranial internal carotid arteries: Normal.

--Anterior cerebral arteries: Normal. Both A1 segments are present.
Patent anterior communicating artery.

--Middle cerebral arteries: Normal.

--Posterior communicating arteries: Present bilaterally.

POSTERIOR CIRCULATION:

--Basilar artery: Normal.

--Posterior cerebral arteries: Normal.

--Superior cerebellar arteries: Normal.

--Inferior cerebellar arteries: Normal anterior and posterior
inferior cerebellar arteries.

VENOUS SINUSES: As permitted by contrast timing, patent.

ANATOMIC VARIANTS: None

DELAYED PHASE: No parenchymal contrast enhancement. Left occipital
lobe temporal encephalomalacia redemonstrated.

Review of the MIP images confirms the above findings.
IMPRESSION: 1. No emergent large vessel occlusion or hemodynamically significant
stenosis of the head or neck.
2. Mild bilateral carotid bifurcation calcific atherosclerosis
without hemodynamically significant stenosis.

## 2018-12-12 IMAGING — DX DG CHEST 1V PORT
1 series · 1 of 1 positions shown · non-contrast
Comparison: 06/30/2018

CLINICAL DATA: Acute onset of chest tightness

EXAM:
PORTABLE CHEST 1 VIEW

[chest]
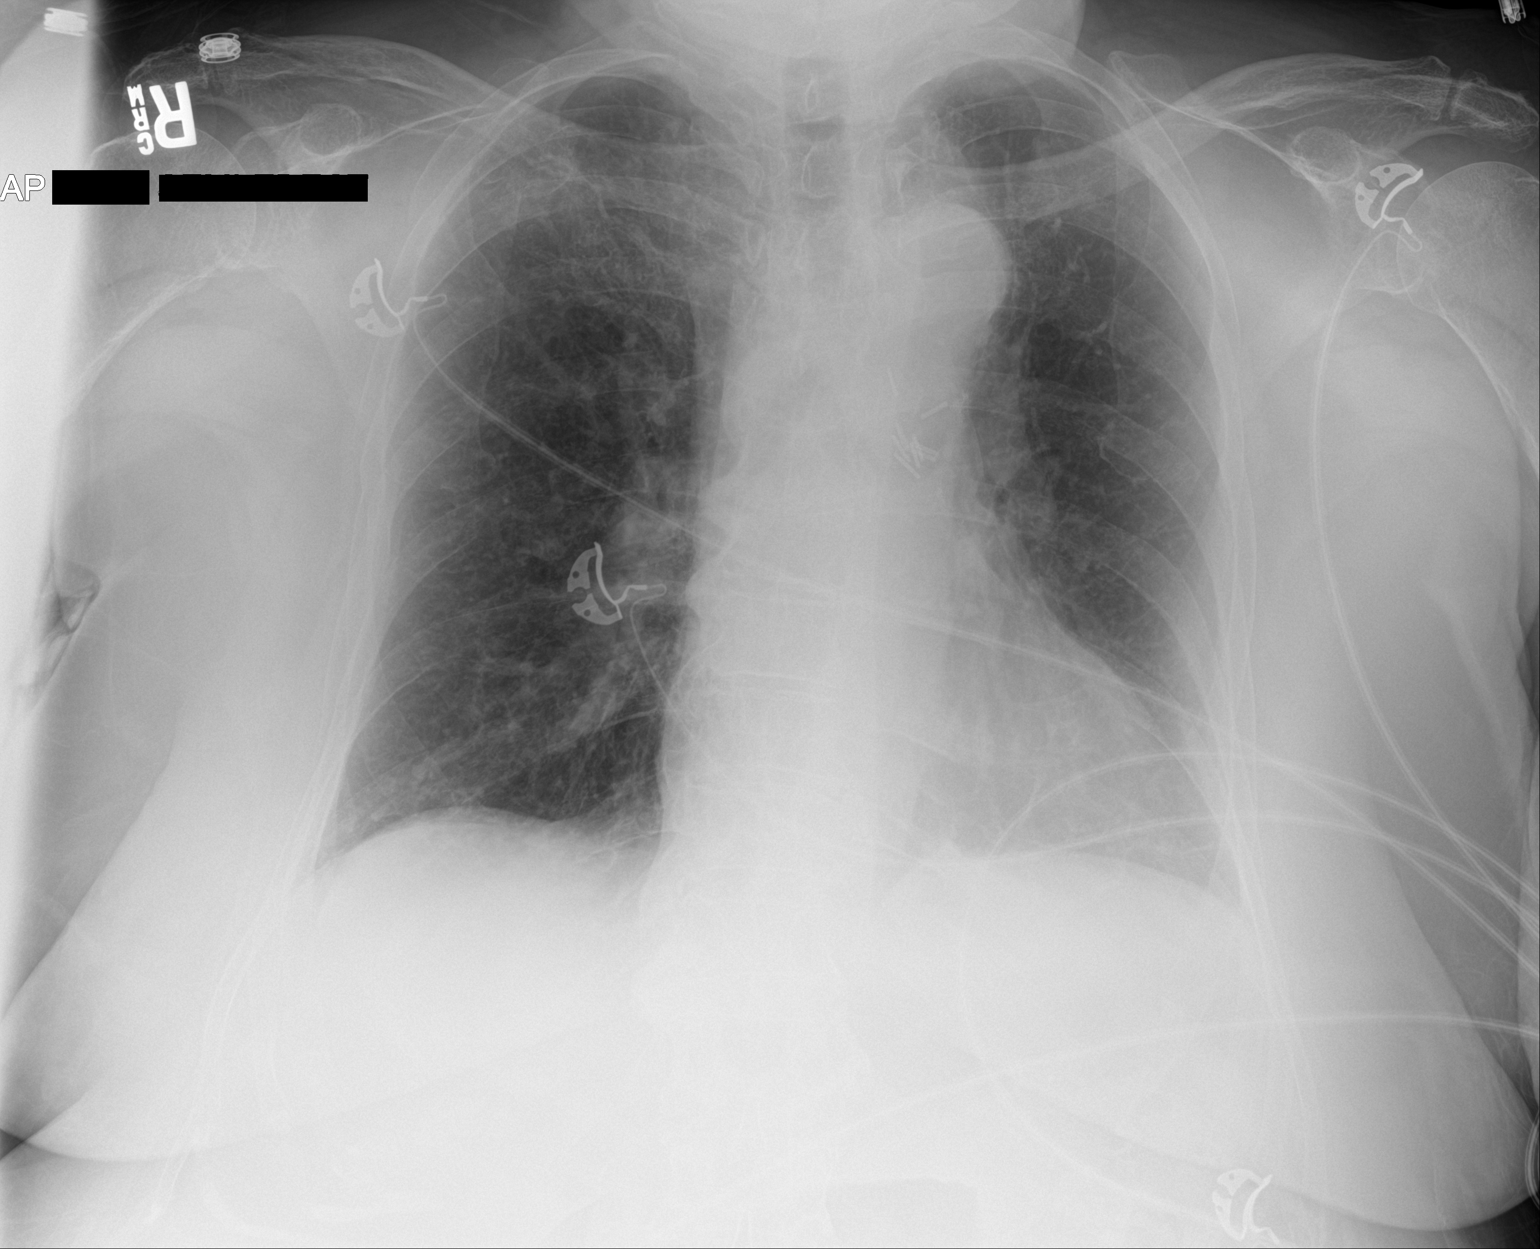

[1 of 1 positions shown; findings below may reference images not displayed]

FINDINGS: Cardiac shadow is mildly enlarged but stable. Aortic calcifications
are again seen. Postsurgical changes in the left hilum are noted.
The lungs are well aerated bilaterally. Previously seen vascular
congestion has resolved. No focal infiltrate or effusion is seen. No
bony abnormality is noted.
IMPRESSION: No acute abnormality seen. Resolution of previously noted vascular
congestion.

## 2019-04-07 DIAGNOSIS — R569 Unspecified convulsions: Secondary | ICD-10-CM | POA: Diagnosis not present

## 2019-04-07 DIAGNOSIS — I69998 Other sequelae following unspecified cerebrovascular disease: Secondary | ICD-10-CM | POA: Diagnosis not present

## 2019-04-07 DIAGNOSIS — Z85118 Personal history of other malignant neoplasm of bronchus and lung: Secondary | ICD-10-CM | POA: Diagnosis not present

## 2019-04-07 DIAGNOSIS — M810 Age-related osteoporosis without current pathological fracture: Secondary | ICD-10-CM | POA: Diagnosis not present

## 2019-04-07 DIAGNOSIS — F329 Major depressive disorder, single episode, unspecified: Secondary | ICD-10-CM | POA: Diagnosis not present

## 2019-04-07 DIAGNOSIS — H9190 Unspecified hearing loss, unspecified ear: Secondary | ICD-10-CM | POA: Diagnosis not present

## 2019-04-07 DIAGNOSIS — R413 Other amnesia: Secondary | ICD-10-CM | POA: Diagnosis not present

## 2019-04-08 DIAGNOSIS — Z79899 Other long term (current) drug therapy: Secondary | ICD-10-CM | POA: Diagnosis not present

## 2019-05-21 ENCOUNTER — Ambulatory Visit
Admission: RE | Admit: 2019-05-21 | Discharge: 2019-05-21 | Disposition: A | Discharge status: Home / Self Care | Payer: Medicare Other | Source: Ambulatory Visit | Attending: Internal Medicine | Admitting: Internal Medicine

## 2019-05-21 ENCOUNTER — Other Ambulatory Visit: Payer: Self-pay

## 2019-05-21 DIAGNOSIS — Z1231 Encounter for screening mammogram for malignant neoplasm of breast: Secondary | ICD-10-CM

## 2019-05-27 ENCOUNTER — Ambulatory Visit (INDEPENDENT_AMBULATORY_CARE_PROVIDER_SITE_OTHER): Payer: Medicare Other | Admitting: Neurology

## 2019-05-27 ENCOUNTER — Encounter: Payer: Self-pay | Admitting: Neurology

## 2019-05-27 ENCOUNTER — Other Ambulatory Visit (INDEPENDENT_AMBULATORY_CARE_PROVIDER_SITE_OTHER): Payer: Medicare Other

## 2019-05-27 ENCOUNTER — Other Ambulatory Visit: Payer: Self-pay

## 2019-05-27 VITALS — BP 170/85 | HR 90 | Ht 66.0 in | Wt 204.4 lb

## 2019-05-27 DIAGNOSIS — R413 Other amnesia: Secondary | ICD-10-CM | POA: Diagnosis not present

## 2019-05-27 DIAGNOSIS — Z8661 Personal history of infections of the central nervous system: Secondary | ICD-10-CM | POA: Diagnosis not present

## 2019-05-27 DIAGNOSIS — R251 Tremor, unspecified: Secondary | ICD-10-CM

## 2019-05-27 DIAGNOSIS — G40219 Localization-related (focal) (partial) symptomatic epilepsy and epileptic syndromes with complex partial seizures, intractable, without status epilepticus: Secondary | ICD-10-CM

## 2019-05-27 MED ORDER — LACOSAMIDE 200 MG PO TABS: 200.0000 mg | ORAL_TABLET | Freq: Two times a day (BID) | ORAL | 3 refills | Status: DC

## 2019-05-27 MED ORDER — LEVETIRACETAM ER 500 MG PO TB24: ORAL_TABLET | ORAL | 3 refills | Status: DC

## 2019-05-27 MED ORDER — LORAZEPAM 1 MG PO TABS: ORAL_TABLET | ORAL | 5 refills | Status: DC

## 2019-05-27 NOTE — Patient Instructions (Addendum)
1. Schedule MRI brain with and without contrast. This will be performed at [facility]. You may call [phone] to schedule.  2. Continue all your medications  3. Follow-up in 6 months, call for any changes  Seizure Precautions: 1. If medication has been prescribed for you to prevent seizures, take it exactly as directed.  Do not stop taking the medicine without talking to your doctor first, even if you have not had a seizure in a long time.   2. Avoid activities in which a seizure would cause danger to yourself or to others.  Don't operate dangerous machinery, swim alone, or climb in high or dangerous places, such as on ladders, roofs, or girders.  Do not drive unless your doctor says you may.  3. If you have any warning that you may have a seizure, lay down in a safe place where you can't hurt yourself.    4.  No driving for 6 months from last seizure, as per [facility].   Please refer to the following link on the [place] website for more information: http://[email or web address]/[email or web address]/[email or web address]   5.  Maintain good sleep hygiene. Avoid alcohol.  6.  Contact your doctor if you have any problems that may be related to the medicine you are taking.  7.  Call 911 and bring the patient back to the ED if:        A.  The seizure lasts longer than 5 minutes.       B.  The patient doesn't awaken shortly after the seizure  C.  The patient has new problems such as difficulty seeing, speaking or moving  D.  The patient was injured during the seizure  E.  The patient has a temperature over 102 F (39C)  F.  The patient vomited and now is having trouble breathing

## 2019-05-27 NOTE — Progress Notes (Signed)
NEUROLOGY FOLLOW UP OFFICE NOTE  Mallory Boone [id number] 07-28-1946  HISTORY OF PRESENT ILLNESS: I had the pleasure of seeing Mallory Boone in follow-up in the neurology clinic on 05/27/2019.  The patient was last seen 5 months ago for seizures. She is again accompanied by her husband who helps supplement the history today. Since her last visit, her husband is happy to report that she has had only 2 seizures in the past 5 months. She had a nocturnal convulsion that lasted 15 seconds or so around 3 months ago, she tried to talk after and was becoming more incoherent and garbled. He gave prn Ativan and she went back to sleep. The last seizure occurred a month ago, he reports it was a very mild one with upper body rocking. This occurred as she was falling asleep. She does not remember the seizures. Her husband continues to report more language issues that are new in the past year. She states she writes everything down and continues to manage her medications without difficulties. Her husband's concern about the medications are lack of anticipation, she would not alert him that she is finishing her bottle and needs the next refill until the last minute. She is noted to have more memory difficulties, as I entered she told me she remembers my name because it is the same as her twin sister's name. At the end of the visit, she asks me to remind her of my name. Over the past month, her husband has also noticed a new onset left hand tremor, he notices it when she is carrying a plate or holding a cloth when sewing. It comes and goes. She denies any headaches, dizziness, vision changes, focal numbness/tingling/weakness, no falls. No side effects on Keppra XR 500mg  4 tabs qhs and Vimpat 200mg  BID.    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 She is amnestic of  her seizures with no prior warning symptoms. Her husband describes stereotyped episodes where she would start rocking and grimacing, perseverating repeatedly saying "I'm okay, it's okay" for 10 seconds. She would be disoriented after, saying "I'm hot, I'm cold, I need to use the bathroom, where is it?" Her husband feels they occur more when she is slowing down or when it is quiet (such as in church). He has not seen any nocturnal seizures. She has only had 2 convulsions when she was initially diagnosed with encephalitis in 1996, none since then. He recalls her trying Dilantin and Lamictal in the past. She has been on Keppra XR 2000mg  daily for at least 10 years, Vimpat 100mg  BID was added on 18 months ago. She was having 2-3 seizures a day, and had a significant reduction with addition of Vimpat. Over the past 6 months, her husband has noticed she would sometimes do a little pedaling of both feet. Her husband reports an average of 2 seizures a week, last seizure was 2 days ago. No associated tongue bite or incontinence. No side effects on medications. She denies any olfactory/gustatory hallucinations, deja vu, rising epigastric sensation, focal numbness/tingling/weakness, myoclonic jerks. She denies any headaches, dizziness, diplopia, dysarthria/dysphagia, neck/back pain, bowel dysfunction. She has urinary frequency. Her hands feels stiff sometimes. She has had cognitive issues since the encephalitis, she does not drive. Her husband manages finances. She has to write things down in a journal daily. She manages her own medications. She is  independent with dressing and bathing. They moved to [place] 3 months ago to be closer to family. She said they moved a year ago. Her mother had Alzheimer's disease, her father had Lewy Body dementia.  Diagnostic Data: MRI brain with and without contrast 06/2018 no acute changes, there was a large area of encephalomalacia at the anterior left temporal lobe, mild chronic  microvascular disease greatest at the left frontal horn, ex vacuo dilatation of the left lateral ventricle.  EEG in 06/2018 showed sharp transients in sleep in the right frontal region with phase reversal at F8.   Epilepsy Risk Factors:  Encephalitis in 1996. Otherwise she had a normal birth and early development.  There is no history of febrile convulsions, significant traumatic brain injury, neurosurgical procedures, or family history of seizures.  Prior AEDs: Dilantin, lamictal  PAST MEDICAL HISTORY: Past Medical History:  Diagnosis Date  . Depression   . Encephalitis   . Osteoporosis   . Seizures ([place])     MEDICATIONS: Current Outpatient Medications on File Prior to Visit  Medication Sig Dispense Refill  . alendronate (FOSAMAX) 70 MG tablet Take 70 mg by mouth once a week. Take with a full glass of water on an empty stomach.    . calcium-vitamin D (OSCAL WITH D) 500-200 MG-UNIT tablet Take 1 tablet by mouth.    . hydrOXYzine (ATARAX/VISTARIL) 25 MG tablet Take 1 tablet (25 mg total) by mouth every 6 (six) hours as needed for itching. 30 tablet 0  . lacosamide (VIMPAT) 200 MG TABS tablet Take 1 tablet (200 mg total) by mouth 2 (two) times daily. 180 tablet 3  . levETIRAcetam (KEPPRA XR) 500 MG 24 hr tablet Take 4 tablets every night 360 tablet 3  . Multiple Vitamins-Minerals (MULTIVITAMIN WITH MINERALS) tablet Take 1 tablet by mouth daily.    . Vilazodone HCl (VIIBRYD) 20 MG TABS Take 20 mg by mouth daily.      No current facility-administered medications on file prior to visit.     ALLERGIES: Allergies  Allergen Reactions  . Ampicillin Rash    Red maculopapular rash occurred in context of coadministration of ampicillin, vancomycin, and ceftriaxone    FAMILY HISTORY: No family history on file.  SOCIAL HISTORY: Social History   Socioeconomic History  . Marital status: Married    Spouse name: Not on file  . Number of children: Not on file  . Years of education: Not on  file  . Highest education level: Not on file  Occupational History  . Not on file  Social Needs  . Financial resource strain: Not on file  . Food insecurity    Worry: Not on file    Inability: Not on file  . Transportation needs    Medical: Not on file    Non-medical: Not on file  Tobacco Use  . Smoking status: Never Smoker  . Smokeless tobacco: Never Used  Substance and Sexual Activity  . Alcohol use: Never    Frequency: Never  . Drug use: Never  . Sexual activity: Yes    Birth control/protection: Post-menopausal  Lifestyle  . Physical activity    Days per week: Not on file    Minutes per session: Not on file  . Stress: Not on file  Relationships  . Social Herbalist on phone: Not on file    Gets together: Not on file    Attends religious service: Not on file    Active member of club or organization: Not  on file    Attends meetings of clubs or organizations: Not on file    Relationship status: Not on file  . Intimate partner violence    Fear of current or ex partner: Not on file    Emotionally abused: Not on file    Physically abused: Not on file    Forced sexual activity: Not on file  Other Topics Concern  . Not on file  Social History Narrative   Right handed      Lives with husband      College edu    REVIEW OF SYSTEMS: Constitutional: No fevers, chills, or sweats, no generalized fatigue, change in appetite Eyes: No visual changes, double vision, eye pain Ear, nose and throat: No hearing loss, ear pain, nasal congestion, sore throat Cardiovascular: No chest pain, palpitations Respiratory:  No shortness of breath at rest or with exertion, wheezes GastrointestinaI: No nausea, vomiting, diarrhea, abdominal pain, fecal incontinence Genitourinary:  No dysuria, urinary retention or frequency Musculoskeletal:  No neck pain, back pain Integumentary: no rash, pruritus Neurological: as above Psychiatric: No depression, insomnia, anxiety Endocrine: No  palpitations, fatigue, diaphoresis, mood swings, change in appetite, change in weight, increased thirst Hematologic/Lymphatic:  No anemia, purpura, petechiae. Allergic/Immunologic: no itchy/runny eyes, nasal congestion, recent allergic reactions, rashes  PHYSICAL EXAM: Vitals:   05/27/19 1310  BP: (!) 170/85  Pulse: 90  SpO2: 95%   General: No acute distress Head:  Normocephalic/atraumatic Skin/Extremities: no rash, no edema Neurological Exam: alert and oriented to person, place. She is more forgetful today, initially saying she remembered my name because it is the same as twin sister's, then later asking me to remind her of my name. She also repeated herself a few times. There is baseline mild expressive aphasia. Fund of knowledge is reduced.  Recent and remote memory are impaired. Cranial nerves: Pupils equal, round, reactive to light. Extraocular movements intact with no nystagmus. Visual fields full. Facial sensation intact. No facial asymmetry. Tongue, uvula, palate midline.  Motor: +cogwheeling on leftBulk and tone normal, muscle strength 5/5 throughout with no pronator drift.  Sensation to light touch intact.  No extinction to double simultaneous stimulation.  Finger to nose testing intact.  Gait slightly unsteady favoring right leg, reporting hip osteoporosis. She has an occasional resting tremor on the left hand/upper extremity that was not reproducible even with distraction. No postural or action tremor. She has good finger and foot taps. Able to rise from chair with arms over chest. Negative pull test.  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 She had a cluster of seizures in September 2019 in the setting of febrile illness. MRI no acute changes with large encephalomacia in left temporal lobe, EEG reported sharp transients over the right frontal  region. Seizures have decreased significantly with increase in Vimpat, she has had 2 seizures in the past 5 months, continue Vimpat 200mg  BID and Keppra XR 500mg  4 tabs qhs. She has prn lorazepam for rescue. She now has a resting tremor on the left upper extremity with cogwheeling, no other parkinsonian signs. Husband also reports continued cognitive decline, which was also noted on today's visit. MRI brain with and without contrast will be ordered to assess for underlying structural abnormality. She does not drive. Follow-up in 6 months or earlier if needed.   Thank you for allowing me to participate in her care.  Please do  not hesitate to call for any questions or concerns.   Ellouise Newer, M.D.   CC: [place]

## 2019-05-28 LAB — BUN+CREAT
BUN/Creatinine Ratio: 24 (ref 12–28)
BUN: 12 mg/dL (ref 8–27)
Creatinine, Ser: 0.51 mg/dL — ABNORMAL LOW (ref 0.57–1.00)
GFR calc Af Amer: 110 mL/min/{1.73_m2} (ref >59)
GFR calc non Af Amer: 95 mL/min/{1.73_m2} (ref >59)

## 2019-05-29 DIAGNOSIS — Z23 Encounter for immunization: Secondary | ICD-10-CM | POA: Diagnosis not present

## 2019-06-04 ENCOUNTER — Ambulatory Visit: Payer: Medicare Other | Admitting: Neurology

## 2019-06-24 ENCOUNTER — Other Ambulatory Visit: Payer: Medicare Other

## 2019-06-25 ENCOUNTER — Ambulatory Visit: Payer: Medicare Other | Admitting: Neurology

## 2019-07-10 ENCOUNTER — Telehealth: Payer: Self-pay | Admitting: Internal Medicine

## 2019-07-10 NOTE — Telephone Encounter (Signed)
Received a new referral from [place] at [facility] for hx of lung cancer. Mrs. Echeverry husband returned my call to schedule an appt for his wife to see Dr. Julien Nordmann on 10/13 at 2:15pm w/labs at 1:45pm. Per Mr. Dalby, his wife has memory issues and will need to be in the appt with her. A msg was sent to Dr. Julien Nordmann for permission.

## 2019-07-13 ENCOUNTER — Telehealth: Payer: Self-pay | Admitting: Internal Medicine

## 2019-07-13 ENCOUNTER — Other Ambulatory Visit: Payer: Self-pay

## 2019-07-13 ENCOUNTER — Ambulatory Visit
Admission: RE | Admit: 2019-07-13 | Discharge: 2019-07-13 | Disposition: A | Discharge status: Home / Self Care | Payer: Medicare Other | Source: Ambulatory Visit | Attending: Neurology | Admitting: Neurology

## 2019-07-13 DIAGNOSIS — R251 Tremor, unspecified: Secondary | ICD-10-CM

## 2019-07-13 DIAGNOSIS — G9389 Other specified disorders of brain: Secondary | ICD-10-CM | POA: Diagnosis not present

## 2019-07-13 MED ORDER — GADOBENATE DIMEGLUMINE 529 MG/ML IV SOLN
19.0000 mL | Freq: Once | INTRAVENOUS | Status: AC | PRN
  Administered 2019-07-13: 19 mL via INTRAVENOUS

## 2019-07-13 NOTE — Telephone Encounter (Signed)
I cld and lft the pt's husband a vm to inform him that Dr. Julien Nordmann has said he will call or facetime Mr. Guastella during the appt. I let him know on the vm that we will provide an escort in case Mrs. Kolton needs assistance.

## 2019-07-15 ENCOUNTER — Telehealth: Payer: Self-pay

## 2019-07-15 NOTE — Telephone Encounter (Signed)
-----   Message from Cameron Sprang, MD sent at 07/14/2019  2:52 PM EDT ----- Pls let husband know that the MRI brain did not show any evidence of tumor, stroke, or bleed. No changes from prior MRI in 2019. Thanks

## 2019-07-15 NOTE — Telephone Encounter (Signed)
Left message informing husband of MRI results. Instructed to call with any concerns.

## 2019-07-16 ENCOUNTER — Other Ambulatory Visit: Payer: Self-pay | Admitting: Neurology

## 2019-07-17 NOTE — Telephone Encounter (Signed)
Dr. Delice Lesch.  Do you prescribe Viibryd?

## 2019-07-20 ENCOUNTER — Other Ambulatory Visit: Payer: Self-pay | Admitting: Medical Oncology

## 2019-07-20 DIAGNOSIS — C3490 Malignant neoplasm of unspecified part of unspecified bronchus or lung: Secondary | ICD-10-CM

## 2019-07-21 ENCOUNTER — Inpatient Hospital Stay: Payer: Medicare Other | Attending: Internal Medicine | Admitting: Internal Medicine

## 2019-07-21 ENCOUNTER — Inpatient Hospital Stay: Payer: Medicare Other

## 2019-07-21 ENCOUNTER — Encounter: Payer: Self-pay | Admitting: Internal Medicine

## 2019-07-21 ENCOUNTER — Other Ambulatory Visit: Payer: Self-pay

## 2019-07-21 VITALS — BP 137/71 | HR 86 | Temp 98.0°F | Resp 22 | Ht 66.0 in | Wt 199.9 lb

## 2019-07-21 DIAGNOSIS — C7A090 Malignant carcinoid tumor of the bronchus and lung: Secondary | ICD-10-CM

## 2019-07-21 DIAGNOSIS — C3490 Malignant neoplasm of unspecified part of unspecified bronchus or lung: Secondary | ICD-10-CM

## 2019-07-21 DIAGNOSIS — D3A090 Benign carcinoid tumor of the bronchus and lung: Secondary | ICD-10-CM | POA: Insufficient documentation

## 2019-07-21 DIAGNOSIS — Z85118 Personal history of other malignant neoplasm of bronchus and lung: Secondary | ICD-10-CM | POA: Insufficient documentation

## 2019-07-21 LAB — CBC WITH DIFFERENTIAL (CANCER CENTER ONLY)
Abs Immature Granulocytes: 0.02 10*3/uL (ref 0.00–0.07)
Basophils Absolute: 0.1 10*3/uL (ref 0.0–0.1)
Basophils Relative: 1 %
Eosinophils Absolute: 0.3 10*3/uL (ref 0.0–0.5)
Eosinophils Relative: 3 %
HCT: 44.4 % (ref 36.0–46.0)
Hemoglobin: 14.3 g/dL (ref 12.0–15.0)
Immature Granulocytes: 0 %
Lymphocytes Relative: 28 %
Lymphs Abs: 3.1 10*3/uL (ref 0.7–4.0)
MCH: 29.7 pg (ref 26.0–34.0)
MCHC: 32.2 g/dL (ref 30.0–36.0)
MCV: 92.3 fL (ref 80.0–100.0)
Monocytes Absolute: 0.8 10*3/uL (ref 0.1–1.0)
Monocytes Relative: 7 %
Neutro Abs: 6.8 10*3/uL (ref 1.7–7.7)
Neutrophils Relative %: 61 %
Platelet Count: 360 10*3/uL (ref 150–400)
RBC: 4.81 MIL/uL (ref 3.87–5.11)
RDW: 13 % (ref 11.5–15.5)
WBC Count: 11.2 10*3/uL — ABNORMAL HIGH (ref 4.0–10.5)
nRBC: 0 % (ref 0.0–0.2)

## 2019-07-21 LAB — CMP (CANCER CENTER ONLY)
ALT: 13 U/L (ref 0–44)
AST: 16 U/L (ref 15–41)
Albumin: 3.5 g/dL (ref 3.5–5.0)
Alkaline Phosphatase: 86 U/L (ref 38–126)
Anion gap: 10 (ref 5–15)
BUN: 12 mg/dL (ref 8–23)
CO2: 27 mmol/L (ref 22–32)
Calcium: 9.6 mg/dL (ref 8.9–10.3)
Chloride: 106 mmol/L (ref 98–111)
Creatinine: 0.78 mg/dL (ref 0.44–1.00)
GFR, Est AFR Am: >60 mL/min (ref >60)
GFR, Estimated: >60 mL/min (ref >60)
Glucose, Bld: 123 mg/dL — ABNORMAL HIGH (ref 70–99)
Potassium: 4 mmol/L (ref 3.5–5.1)
Sodium: 143 mmol/L (ref 135–145)
Total Bilirubin: 0.3 mg/dL (ref 0.3–1.2)
Total Protein: 7.4 g/dL (ref 6.5–8.1)
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
## 2019-07-22 ENCOUNTER — Telehealth: Payer: Self-pay | Admitting: Internal Medicine

## 2019-07-22 NOTE — Telephone Encounter (Signed)
Scheduled appt per 10/13 los - mailed reminder letter with appt date and time

## 2019-07-27 ENCOUNTER — Other Ambulatory Visit: Payer: Self-pay

## 2019-07-27 ENCOUNTER — Ambulatory Visit (HOSPITAL_COMMUNITY)
Admission: RE | Admit: 2019-07-27 | Discharge: 2019-07-27 | Disposition: A | Discharge status: Home / Self Care | Payer: Medicare Other | Source: Ambulatory Visit | Attending: Internal Medicine | Admitting: Internal Medicine

## 2019-07-27 ENCOUNTER — Encounter (HOSPITAL_COMMUNITY): Payer: Self-pay

## 2019-07-27 DIAGNOSIS — C7A090 Malignant carcinoid tumor of the bronchus and lung: Secondary | ICD-10-CM | POA: Diagnosis not present

## 2019-07-27 DIAGNOSIS — C3412 Malignant neoplasm of upper lobe, left bronchus or lung: Secondary | ICD-10-CM | POA: Diagnosis not present

## 2019-07-27 MED ORDER — IOHEXOL 300 MG/ML  SOLN
75.0000 mL | Freq: Once | INTRAMUSCULAR | Status: AC | PRN
  Administered 2019-07-27: 75 mL via INTRAVENOUS

## 2019-07-27 MED ORDER — SODIUM CHLORIDE (PF) 0.9 % IJ SOLN
INTRAMUSCULAR | Status: AC
  Filled 2019-07-27: qty 50

## 2019-10-12 DIAGNOSIS — R82998 Other abnormal findings in urine: Secondary | ICD-10-CM | POA: Diagnosis not present

## 2019-10-14 DIAGNOSIS — Z0000 Encounter for general adult medical examination without abnormal findings: Secondary | ICD-10-CM | POA: Diagnosis not present

## 2019-10-14 DIAGNOSIS — F3341 Major depressive disorder, recurrent, in partial remission: Secondary | ICD-10-CM | POA: Diagnosis not present

## 2019-10-14 DIAGNOSIS — G0490 Encephalitis and encephalomyelitis, unspecified: Secondary | ICD-10-CM | POA: Diagnosis not present

## 2019-10-14 DIAGNOSIS — M810 Age-related osteoporosis without current pathological fracture: Secondary | ICD-10-CM | POA: Diagnosis not present

## 2019-10-14 DIAGNOSIS — R569 Unspecified convulsions: Secondary | ICD-10-CM | POA: Diagnosis not present

## 2019-10-14 DIAGNOSIS — Z9081 Acquired absence of spleen: Secondary | ICD-10-CM | POA: Diagnosis not present

## 2019-10-14 DIAGNOSIS — Z1339 Encounter for screening examination for other mental health and behavioral disorders: Secondary | ICD-10-CM | POA: Diagnosis not present

## 2019-10-14 DIAGNOSIS — R413 Other amnesia: Secondary | ICD-10-CM | POA: Diagnosis not present

## 2019-10-14 DIAGNOSIS — Z1331 Encounter for screening for depression: Secondary | ICD-10-CM | POA: Diagnosis not present

## 2019-10-14 DIAGNOSIS — Z85118 Personal history of other malignant neoplasm of bronchus and lung: Secondary | ICD-10-CM | POA: Diagnosis not present

## 2019-10-14 DIAGNOSIS — I69998 Other sequelae following unspecified cerebrovascular disease: Secondary | ICD-10-CM | POA: Diagnosis not present

## 2019-10-16 DIAGNOSIS — Z1212 Encounter for screening for malignant neoplasm of rectum: Secondary | ICD-10-CM | POA: Diagnosis not present

## 2019-11-18 ENCOUNTER — Encounter: Payer: Self-pay | Admitting: Neurology

## 2019-11-23 DIAGNOSIS — M810 Age-related osteoporosis without current pathological fracture: Secondary | ICD-10-CM | POA: Diagnosis not present

## 2019-12-02 ENCOUNTER — Encounter: Payer: Self-pay | Admitting: Neurology

## 2019-12-02 ENCOUNTER — Telehealth (INDEPENDENT_AMBULATORY_CARE_PROVIDER_SITE_OTHER): Payer: Medicare Other | Admitting: Neurology

## 2019-12-02 ENCOUNTER — Other Ambulatory Visit: Payer: Self-pay

## 2019-12-02 VITALS — Ht 66.0 in

## 2019-12-02 DIAGNOSIS — G40219 Localization-related (focal) (partial) symptomatic epilepsy and epileptic syndromes with complex partial seizures, intractable, without status epilepticus: Secondary | ICD-10-CM | POA: Diagnosis not present

## 2019-12-02 DIAGNOSIS — R251 Tremor, unspecified: Secondary | ICD-10-CM

## 2019-12-02 MED ORDER — LACOSAMIDE 200 MG PO TABS: 200.0000 mg | ORAL_TABLET | Freq: Two times a day (BID) | ORAL | 3 refills | Status: DC

## 2019-12-02 MED ORDER — LEVETIRACETAM ER 500 MG PO TB24: ORAL_TABLET | ORAL | 3 refills | Status: DC

## 2019-12-02 NOTE — Progress Notes (Signed)
Virtual Visit via Video Note The purpose of this virtual visit is to provide medical care while limiting exposure to the novel coronavirus.    Consent was obtained for video visit:  Yes.   Answered questions that patient had about telehealth interaction:  Yes.   I discussed the limitations, risks, security and privacy concerns of performing an evaluation and management service by telemedicine. I also discussed with the patient that there may be a patient responsible charge related to this service. The patient expressed understanding and agreed to proceed.  Pt location: Home Physician Location: office Name of referring provider:  Pa, [place] As* I connected with Mallory Boone at patients initiation/request on 12/02/2019 at 11:30 AM EST by video enabled telemedicine application and verified that I am speaking with the correct person using two identifiers. Pt MRN:  [id number] Pt DOB:  Jul 09, 1946 Video Participants:  Mallory Boone;  Christella Hartigan (spouse)   History of Present Illness:  The patient was seen as a virtual video visit on 12/02/2019. She was last seen 6 months ago for seizures. Her husband is present during the e-visit to provide additional information. Since her last visit, she has been doing very well with no seizures. She is on Vimpat 200mg  BID and Keppra XR 500mg  4 tabs qhs without side effects. She denies any headaches, dizziness, vision changes, focal numbness/tingling/weakness, no falls. Sleep is good. On her last visit, her husband reported more memory and language difficulties. 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 Her husband also noticed a left hand tremor, she mostly has it when she is carrying something and "it rattles," unchanged from last visit. She is able to cross stitch with her right hand, holding the  cloth with her left hand. Her husband feels they seem to be in a nice place, stable. When asked about her memory, she states she writes everything in her book, she thinks she is remembering more and more. She manages her own medications without issues.   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 She is amnestic of her seizures with no prior warning symptoms. Her husband describes stereotyped episodes where she would start rocking and grimacing, perseverating repeatedly saying "I'm okay, it's okay" for 10 seconds. She would be disoriented after, saying "I'm hot, I'm cold, I need to use the bathroom, where is it?" Her husband feels they occur more when she is slowing down or when it is quiet (such as in church). He has not seen any nocturnal seizures. She has only had 2 convulsions when she was initially diagnosed with encephalitis in 1996, none since then. He recalls her trying Dilantin and Lamictal in the past. She has been on Keppra XR 2000mg  daily for at least 10 years, Vimpat 100mg  BID was added on 18 months ago. She was having 2-3 seizures a day, and had a significant reduction with addition of Vimpat. Over the past 6 months, her husband has noticed she would sometimes do a little pedaling of both feet. Her husband reports an average of 2 seizures a week, last seizure was 2 days ago. No associated tongue bite or incontinence. No side effects on medications. She denies any olfactory/gustatory hallucinations, deja vu, rising epigastric sensation, focal numbness/tingling/weakness, myoclonic  jerks. She denies any headaches, dizziness, diplopia, dysarthria/dysphagia, neck/back pain, bowel dysfunction. She has urinary frequency. Her hands feels stiff sometimes. She has had cognitive issues since the encephalitis, she does not drive. Her husband manages  finances. She has to write things down in a journal daily. She manages her own medications. She is independent with dressing and bathing. They moved to [place] 3 months ago to be closer to family. She said they moved a year ago. Her mother had Alzheimer's disease, her father had Lewy Body dementia.  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  EEG in 06/2018 showed sharp transients in sleep in the right frontal region with phase reversal at F8.   Epilepsy Risk Factors:  Encephalitis in 1996. Otherwise she had a normal birth and early development.  There is no history of febrile convulsions, significant traumatic brain injury, neurosurgical procedures, or family history of seizures.  Prior AEDs: Dilantin, lamictal   MEDICATIONS: Current Outpatient Medications on File Prior to Visit  Medication Sig Dispense Refill  . alendronate (FOSAMAX) 70 MG tablet Take 70 mg by mouth once a week. Take with a full glass of water on an empty stomach.    . calcium-vitamin D (OSCAL WITH D) 500-200 MG-UNIT tablet Take 1 tablet by mouth.    . lacosamide (VIMPAT) 200 MG TABS tablet Take 1 tablet (200 mg total) by mouth 2 (two) times daily. 180 tablet 3  . levETIRAcetam (KEPPRA XR) 500 MG 24 hr tablet TAKE 4 TABLETS EVERY NIGHT 360 tablet 3  . LORazepam (ATIVAN) 1 MG tablet Take 1 tablet as needed for cluster of seizures. 10 tablet 5  . Multiple Vitamins-Minerals (MULTIVITAMIN WITH MINERALS) tablet Take 1 tablet by mouth daily.    . Vilazodone HCl 20 MG TABS Take 20 mg by mouth daily.     No current facility-administered medications on file prior to visit.     Observations/Objective:   Vitals:   12/02/19 0838  Height: 5\' 6"  (1.676 m)   GEN:  The patient appears stated age and  is in NAD.  Neurological examination: Patient is awake, alert. No aphasia or dysarthria. Intact fluency and comprehension. Remote and recent memory impaired. Cranial nerves: Extraocular movements intact with no nystagmus. No facial asymmetry. Motor: moves all extremities symmetrically, at least anti-gravity x 4. No incoordination on finger to nose testing. Gait: narrow-based and steady. No resting, postural or endpoint tremor seen. She is noted by husband to have a left hand tremor with ambulation.   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 She had a cluster of seizures in September 2019 in the setting of febrile illness. Most recent MRI stable with encephalomacia in left temporal lobe, EEG reported sharp transients over the right frontal region. She has been seizure-free for the past 6 months, continue Vimpat 200mg  BID and Keppra XR 500mg  4 tabs qhs. She has prn lorazepam for rescue. Continue to monitor tremor. She does not drive. Follow-up in 6 months or earlier if needed.   Follow Up Instructions:   -I discussed the assessment and treatment plan with the patient/husband. The patient/husband were provided an opportunity to ask questions and all  were answered. The patient/husband agreed with the plan and demonstrated an understanding of the instructions.   The patient/husband were advised to call back or seek an in-person evaluation if the symptoms worsen or if the condition fails to improve as anticipated.    Cameron Sprang, MD

## 2020-04-18 DIAGNOSIS — R413 Other amnesia: Secondary | ICD-10-CM | POA: Diagnosis not present

## 2020-04-18 DIAGNOSIS — G0490 Encephalitis and encephalomyelitis, unspecified: Secondary | ICD-10-CM | POA: Diagnosis not present

## 2020-04-18 DIAGNOSIS — H9190 Unspecified hearing loss, unspecified ear: Secondary | ICD-10-CM | POA: Diagnosis not present

## 2020-04-18 DIAGNOSIS — I69998 Other sequelae following unspecified cerebrovascular disease: Secondary | ICD-10-CM | POA: Diagnosis not present

## 2020-04-18 DIAGNOSIS — M8580 Other specified disorders of bone density and structure, unspecified site: Secondary | ICD-10-CM | POA: Diagnosis not present

## 2020-04-18 DIAGNOSIS — Z85118 Personal history of other malignant neoplasm of bronchus and lung: Secondary | ICD-10-CM | POA: Diagnosis not present

## 2020-04-18 DIAGNOSIS — R569 Unspecified convulsions: Secondary | ICD-10-CM | POA: Diagnosis not present

## 2020-04-18 DIAGNOSIS — Z9081 Acquired absence of spleen: Secondary | ICD-10-CM | POA: Diagnosis not present

## 2020-06-20 MED ORDER — LACOSAMIDE 200 MG PO TABS: 200.0000 mg | ORAL_TABLET | Freq: Two times a day (BID) | ORAL | 3 refills | Status: DC

## 2020-06-27 ENCOUNTER — Encounter: Payer: Self-pay | Admitting: Neurology

## 2020-06-27 ENCOUNTER — Other Ambulatory Visit: Payer: Self-pay

## 2020-06-27 ENCOUNTER — Ambulatory Visit (INDEPENDENT_AMBULATORY_CARE_PROVIDER_SITE_OTHER): Payer: Medicare Other | Admitting: Neurology

## 2020-06-27 VITALS — BP 120/73 | HR 77 | Ht 66.0 in | Wt 194.6 lb

## 2020-06-27 DIAGNOSIS — G40219 Localization-related (focal) (partial) symptomatic epilepsy and epileptic syndromes with complex partial seizures, intractable, without status epilepticus: Secondary | ICD-10-CM | POA: Diagnosis not present

## 2020-06-27 DIAGNOSIS — R251 Tremor, unspecified: Secondary | ICD-10-CM

## 2020-06-27 MED ORDER — LACOSAMIDE 200 MG PO TABS: 200.0000 mg | ORAL_TABLET | Freq: Two times a day (BID) | ORAL | 3 refills | Status: DC

## 2020-06-27 MED ORDER — LEVETIRACETAM ER 500 MG PO TB24
ORAL_TABLET | ORAL | 3 refills | Status: DC
Start: 2020-06-27 — End: 2021-03-17

## 2020-06-27 NOTE — Patient Instructions (Signed)
Good to see you! Continue all your medications. Follow-up in 6 months, call for any changes.  Seizure Precautions: 1. If medication has been prescribed for you to prevent seizures, take it exactly as directed.  Do not stop taking the medicine without talking to your doctor first, even if you have not had a seizure in a long time.   2. Avoid activities in which a seizure would cause danger to yourself or to others.  Don't operate dangerous machinery, swim alone, or climb in high or dangerous places, such as on ladders, roofs, or girders.  Do not drive unless your doctor says you may.  3. If you have any warning that you may have a seizure, lay down in a safe place where you can't hurt yourself.    4.  No driving for 6 months from last seizure, as per [place] state law.   Please refer to the following link on the Epilepsy Foundation of America's website for more information: http://www.epilepsyfoundation.org/answerplace/Social/driving/drivingu.cfm   5.  Maintain good sleep hygiene. Avoid alcohol.  6.  Contact your doctor if you have any problems that may be related to the medicine you are taking.  7.  Call 911 and bring the patient back to the ED if:        A.  The seizure lasts longer than 5 minutes.       B.  The patient doesn't awaken shortly after the seizure  C.  The patient has new problems such as difficulty seeing, speaking or moving  D.  The patient was injured during the seizure  E.  The patient has a temperature over 102 F (39C)  F.  The patient vomited and now is having trouble breathing         

## 2020-06-27 NOTE — Progress Notes (Signed)
NEUROLOGY FOLLOW UP OFFICE NOTE  Mallory Boone [id number] 1945/10/13  HISTORY OF PRESENT ILLNESS: I had the pleasure of seeing Mallory Boone in follow-up in the neurology clinic on 06/27/2020.  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 She is again accompanied by her husband who helps supplement the history today.  Records and images were personally reviewed where available.  She is taking Vimpat 200mg  BID and Keppra XR 500mg  4 tabs qhs without side effects. She has prn lorazepam for seizure clusters. She continues to do well seizure-free for a year now. She denies any headaches, dizziness, vision changes, no falls. Sleep is good. Mood is stable. Memory is the same, she says she writes things down and gets stressed sometimes when she cannot remember things. She manages her own medications. Her husband manages finances. She does not drive. She is noted to have a left hand resting tremor, previously noted on last visits. It does not affect daily activities, mostly when she is carrying a cup, but not when she is drinking from a cup.   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 She is amnestic of her seizures with no prior warning symptoms. Her husband describes stereotyped episodes where she would start rocking and grimacing, perseverating repeatedly saying "I'm okay, it's okay" for 10 seconds. She would be disoriented after, saying "I'm hot, I'm cold, I need to use the bathroom, where is it?" Her husband feels they occur more when she is slowing down or when it is quiet (such as in church). He has not seen any nocturnal seizures. She has only had 2 convulsions when she was initially diagnosed with encephalitis in 1996, none since then. He recalls her trying Dilantin and Lamictal in the past. She has been on Keppra  XR 2000mg  daily for at least 10 years, Vimpat 100mg  BID was added on 18 months ago. She was having 2-3 seizures a day, and had a significant reduction with addition of Vimpat. Over the past 6 months, her husband has noticed she would sometimes do a little pedaling of both feet. Her husband reports an average of 2 seizures a week, last seizure was 2 days ago. No associated tongue bite or incontinence. No side effects on medications. She denies any olfactory/gustatory hallucinations, deja vu, rising epigastric sensation, focal numbness/tingling/weakness, myoclonic jerks. She denies any headaches, dizziness, diplopia, dysarthria/dysphagia, neck/back pain, bowel dysfunction. She has urinary frequency. Her hands feels stiff sometimes. She has had cognitive issues since the encephalitis, she does not drive. Her husband manages finances. She has to write things down in a journal daily. She manages her own medications. She is independent with dressing and bathing. They moved to [place] 3 months ago to be closer to family. She said they moved a year ago. Her mother had Alzheimer's disease, her father had Lewy Body dementia.  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  EEG in 06/2018 showed sharp transients in sleep in the right frontal region with phase reversal at F8.   Epilepsy Risk Factors:  Encephalitis in 1996. Otherwise she had a normal birth and early development.  There is no history of febrile  convulsions, significant traumatic brain injury, neurosurgical procedures, or family history of seizures.  Prior AEDs: Dilantin, lamictal  PAST MEDICAL HISTORY: Past Medical History:  Diagnosis Date  . Depression   . Encephalitis   . Osteoporosis   . Seizures ([facility])      MEDICATIONS: Current Outpatient Medications on File Prior to Visit  Medication Sig Dispense Refill  . alendronate (FOSAMAX) 70 MG tablet Take 70 mg by mouth once a week. Take with a full glass of water on an empty stomach.    . calcium-vitamin D (OSCAL WITH D) 500-200 MG-UNIT tablet Take 1 tablet by mouth.    . lacosamide (VIMPAT) 200 MG TABS tablet Take 1 tablet (200 mg total) by mouth 2 (two) times daily. 180 tablet 3  . levETIRAcetam (KEPPRA XR) 500 MG 24 hr tablet Take 4 tablets every night 360 tablet 3  . LORazepam (ATIVAN) 1 MG tablet Take 1 tablet as needed for cluster of seizures. 10 tablet 5  . Multiple Vitamins-Minerals (MULTIVITAMIN WITH MINERALS) tablet Take 1 tablet by mouth daily.    . Vilazodone HCl 20 MG TABS Take 20 mg by mouth daily.     No current facility-administered medications on file prior to visit.    ALLERGIES: Allergies  Allergen Reactions  . Ampicillin Rash    Red maculopapular rash occurred in context of coadministration of ampicillin, vancomycin, and ceftriaxone    FAMILY HISTORY: History reviewed. No pertinent family history.  SOCIAL HISTORY: Social History   Socioeconomic History  . Marital status: Married    Spouse name: Not on file  . Number of children: Not on file  . Years of education: College  . Highest education level: Not on file  Occupational History  . Not on file  Tobacco Use  . Smoking status: Never Smoker  . Smokeless tobacco: Never Used  Vaping Use  . Vaping Use: Never assessed  Substance and Sexual Activity  . Alcohol use: Never  . Drug use: Never  . Sexual activity: Yes    Birth control/protection: Post-menopausal  Other Topics Concern  . Not on file  Social History Narrative   Right handed      Lives with husband      Secretary/administrator edu   Social Determinants of Health   Financial Resource Strain:   . Difficulty of Paying Living Expenses: Not on file  Food Insecurity:   . Worried About Charity fundraiser in the  Last Year: Not on file  . Ran Out of Food in the Last Year: Not on file  Transportation Needs:   . Lack of Transportation (Medical): Not on file  . Lack of Transportation (Non-Medical): Not on file  Physical Activity:   . Days of Exercise per Week: Not on file  . Minutes of Exercise per Session: Not on file  Stress:   . Feeling of Stress : Not on file  Social Connections:   . Frequency of Communication with Friends and Family: Not on file  . Frequency of Social Gatherings with Friends and Family: Not on file  . Attends Religious Services: Not on file  . Active Member of Clubs or Organizations: Not on file  . Attends Archivist Meetings: Not on file  . Marital Status: Not on file  Intimate Partner Violence:   . Fear of Current or Ex-Partner: Not on file  . Emotionally Abused: Not on file  . Physically Abused: Not on file  . Sexually Abused: Not on file  PHYSICAL EXAM: Vitals:   06/27/20 1500  BP: 120/73  Pulse: 77  SpO2: 97%   General: No acute distress Head:  Normocephalic/atraumatic Skin/Extremities: No rash, no edema Neurological Exam: alert and oriented to person, place, and time. No aphasia or dysarthria. Fund of knowledge is appropriate.  Recent and remote memory are impaired.  Attention and concentration are normal.   Cranial nerves: Pupils equal, round. Extraocular movements intact with no nystagmus. Visual fields full.  No facial asymmetry.  Motor: Increased tone on left wrist. Muscle strength 5/5 throughout with no pronator drift.   Finger to nose testing intact.  Gait slightly wide-based with good arm swing, occasional pill-rolling tremor on left hand on ambulation. There is occasional left hand resting tremor noted. No postural or endpoint tremor. Good finger and foot taps. Able to rise from chair with arms over chest, no postural instability.   IMPRESSION: This is a very pleasant 74 yo RH woman with a history of encephalitis in 1996 with subsequent  intractable focal seizures with impaired awareness and cognitive changes. She had a cluster of seizures in September 2019 in the setting of febrile illness. 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 She has been seizure-free for a year, refills sent for Vimpat 200mg  BID and Keppra XR 500mg  4 tabs qhs. She has a resting left hand tremor but no other signs of Parkinson's disease, findings discussed with patient and husband today, continue to monitor. She does not drive. Follow-up in 6 months or earlier if needed.    Thank you for allowing me to participate in her care.  Please do not hesitate to call for any questions or concerns.   Ellouise Newer, M.D.   CC: [place]

## 2020-07-04 DIAGNOSIS — Z23 Encounter for immunization: Secondary | ICD-10-CM | POA: Diagnosis not present

## 2020-07-18 ENCOUNTER — Ambulatory Visit (HOSPITAL_COMMUNITY)
Admission: RE | Admit: 2020-07-18 | Discharge: 2020-07-18 | Disposition: A | Discharge status: Home / Self Care | Payer: Medicare Other | Source: Ambulatory Visit | Attending: Internal Medicine | Admitting: Internal Medicine

## 2020-07-18 ENCOUNTER — Inpatient Hospital Stay: Payer: Medicare Other | Attending: Internal Medicine

## 2020-07-18 ENCOUNTER — Other Ambulatory Visit: Payer: Self-pay

## 2020-07-18 DIAGNOSIS — S2231XD Fracture of one rib, right side, subsequent encounter for fracture with routine healing: Secondary | ICD-10-CM | POA: Diagnosis not present

## 2020-07-18 DIAGNOSIS — C7A090 Malignant carcinoid tumor of the bronchus and lung: Secondary | ICD-10-CM | POA: Diagnosis not present

## 2020-07-18 DIAGNOSIS — Z85110 Personal history of malignant carcinoid tumor of bronchus and lung: Secondary | ICD-10-CM | POA: Insufficient documentation

## 2020-07-18 DIAGNOSIS — C7A8 Other malignant neuroendocrine tumors: Secondary | ICD-10-CM | POA: Diagnosis not present

## 2020-07-18 DIAGNOSIS — I700 Atherosclerosis of aorta: Secondary | ICD-10-CM | POA: Diagnosis not present

## 2020-07-18 DIAGNOSIS — M47814 Spondylosis without myelopathy or radiculopathy, thoracic region: Secondary | ICD-10-CM | POA: Diagnosis not present

## 2020-07-18 LAB — CBC WITH DIFFERENTIAL (CANCER CENTER ONLY)
Abs Immature Granulocytes: 0.02 10*3/uL (ref 0.00–0.07)
Basophils Absolute: 0.1 10*3/uL (ref 0.0–0.1)
Basophils Relative: 1 %
Eosinophils Absolute: 0.3 10*3/uL (ref 0.0–0.5)
Eosinophils Relative: 3 %
HCT: 43.5 % (ref 36.0–46.0)
Hemoglobin: 13.8 g/dL (ref 12.0–15.0)
Immature Granulocytes: 0 %
Lymphocytes Relative: 32 %
Lymphs Abs: 2.9 10*3/uL (ref 0.7–4.0)
MCH: 29.2 pg (ref 26.0–34.0)
MCHC: 31.7 g/dL (ref 30.0–36.0)
MCV: 92 fL (ref 80.0–100.0)
Monocytes Absolute: 0.6 10*3/uL (ref 0.1–1.0)
Monocytes Relative: 7 %
Neutro Abs: 5.1 10*3/uL (ref 1.7–7.7)
Neutrophils Relative %: 57 %
Platelet Count: 320 10*3/uL (ref 150–400)
RBC: 4.73 MIL/uL (ref 3.87–5.11)
RDW: 13.2 % (ref 11.5–15.5)
WBC Count: 9 10*3/uL (ref 4.0–10.5)
nRBC: 0 % (ref 0.0–0.2)

## 2020-07-18 LAB — CMP (CANCER CENTER ONLY)
ALT: 11 U/L (ref 0–44)
AST: 14 U/L — ABNORMAL LOW (ref 15–41)
Albumin: 3.3 g/dL — ABNORMAL LOW (ref 3.5–5.0)
Alkaline Phosphatase: 68 U/L (ref 38–126)
Anion gap: 6 (ref 5–15)
BUN: 12 mg/dL (ref 8–23)
CO2: 26 mmol/L (ref 22–32)
Calcium: 9.3 mg/dL (ref 8.9–10.3)
Chloride: 108 mmol/L (ref 98–111)
Creatinine: 0.68 mg/dL (ref 0.44–1.00)
GFR, Estimated: >60 mL/min (ref >60)
Glucose, Bld: 97 mg/dL (ref 70–99)
Potassium: 4.2 mmol/L (ref 3.5–5.1)
Sodium: 140 mmol/L (ref 135–145)
Total Bilirubin: 0.5 mg/dL (ref 0.3–1.2)
Total Protein: 7 g/dL (ref 6.5–8.1)

## 2020-07-18 MED ORDER — IOHEXOL 300 MG/ML  SOLN
75.0000 mL | Freq: Once | INTRAMUSCULAR | Status: AC | PRN
  Administered 2020-07-18: 75 mL via INTRAVENOUS

## 2020-07-20 ENCOUNTER — Encounter: Payer: Self-pay | Admitting: Internal Medicine

## 2020-07-20 ENCOUNTER — Other Ambulatory Visit: Payer: Self-pay

## 2020-07-20 ENCOUNTER — Inpatient Hospital Stay (HOSPITAL_BASED_OUTPATIENT_CLINIC_OR_DEPARTMENT_OTHER): Payer: Medicare Other | Admitting: Internal Medicine

## 2020-07-20 VITALS — BP 142/77 | HR 66 | Temp 98.1°F | Resp 18 | Ht 66.0 in | Wt 193.5 lb

## 2020-07-20 DIAGNOSIS — C3490 Malignant neoplasm of unspecified part of unspecified bronchus or lung: Secondary | ICD-10-CM | POA: Diagnosis not present

## 2020-07-20 DIAGNOSIS — D3A090 Benign carcinoid tumor of the bronchus and lung: Secondary | ICD-10-CM

## 2020-07-20 DIAGNOSIS — Z85110 Personal history of malignant carcinoid tumor of bronchus and lung: Secondary | ICD-10-CM | POA: Diagnosis not present

## 2020-07-20 NOTE — Progress Notes (Signed)
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   PRIOR THERAPY: Status post left upper lobectomy with lymph node dissection.  CURRENT THERAPY: Observation.  INTERVAL HISTORY: Mallory Boone 74 y.o. female returns to the clinic today for follow-up visit.  The patient is feeling fine today with no concerning complaints.  She denied having any current chest pain, shortness of breath, cough or hemoptysis.  She denied having any fever or chills.  She has no nausea, vomiting, diarrhea or constipation.  She has no significant weight loss or night sweats.  She is here today for evaluation and repeat CT scan of the chest for restaging of her disease.  MEDICAL HISTORY: Past Medical History:  Diagnosis Date  . Depression   . Encephalitis   . Osteoporosis   . Seizures ([facility])     ALLERGIES:  is allergic to ampicillin.  MEDICATIONS:  Current Outpatient Medications  Medication Sig Dispense Refill  . alendronate (FOSAMAX) 70 MG tablet Take 70 mg by mouth once a week. Take with a full glass of water on an empty stomach.    . calcium-vitamin D (OSCAL WITH D) 500-200 MG-UNIT tablet Take 1 tablet by mouth.    . lacosamide (VIMPAT) 200 MG TABS tablet Take 1 tablet (200 mg total) by mouth 2 (two) times daily. 180 tablet 3  . levETIRAcetam (KEPPRA XR) 500 MG 24 hr tablet Take 4 tablets every night 360 tablet 3  . LORazepam (ATIVAN) 1 MG tablet Take 1 tablet as needed for cluster of seizures. 10 tablet 5  . Multiple Vitamins-Minerals (MULTIVITAMIN WITH MINERALS) tablet Take 1 tablet by mouth daily.    . Vilazodone HCl 20 MG TABS Take 20 mg by mouth daily.     No current facility-administered medications for this visit.    SURGICAL HISTORY:  Past Surgical History:   Procedure Laterality Date  . ABDOMINAL HYSTERECTOMY    . left lobe lobectomy    . SPLENECTOMY, TOTAL      REVIEW OF SYSTEMS:  A comprehensive review of systems was negative.   PHYSICAL EXAMINATION: General appearance: alert, cooperative and no distress Head: Normocephalic, without obvious abnormality, atraumatic Neck: no adenopathy, no JVD, supple, symmetrical, trachea midline and thyroid not enlarged, symmetric, no tenderness/mass/nodules Lymph nodes: Cervical, supraclavicular, and axillary nodes normal. Resp: clear to auscultation bilaterally Back: symmetric, no curvature. ROM normal. No CVA tenderness. Cardio: regular rate and rhythm, S1, S2 normal, no murmur, click, rub or gallop GI: soft, non-tender; bowel sounds normal; no masses,  no organomegaly Extremities: extremities normal, atraumatic, no cyanosis or edema  ECOG PERFORMANCE STATUS: 1 - Symptomatic but completely ambulatory  Blood pressure (!) 142/77, pulse 66, temperature 98.1 F (36.7 C), temperature source Tympanic, resp. rate 18, height 5\' 6"  (1.676 m), weight 193 lb 8 oz (87.8 kg), SpO2 97 %.  LABORATORY DATA: Lab Results  Component Value Date   WBC 9.0 07/18/2020   HGB 13.8 07/18/2020   HCT 43.5 07/18/2020   MCV 92.0 07/18/2020   PLT 320 07/18/2020      Chemistry      Component Value Date/Time   NA 140 07/18/2020 0948   K 4.2 07/18/2020 0948   CL 108 07/18/2020 0948   CO2 26 07/18/2020 0948   BUN 12 07/18/2020 0948   BUN 12  05/27/2019 0000   CREATININE 0.68 07/18/2020 0948      Component Value Date/Time   CALCIUM 9.3 07/18/2020 0948   ALKPHOS 68 07/18/2020 0948   AST 14 (L) 07/18/2020 0948   ALT 11 07/18/2020 0948   BILITOT 0.5 07/18/2020 0948       RADIOGRAPHIC STUDIES: CT Chest W Contrast  Result Date: 07/18/2020 CLINICAL DATA:  Neuroendocrine tumor of the GI tract. EXAM: CT CHEST WITH CONTRAST TECHNIQUE: Multidetector CT imaging of the chest was performed during intravenous contrast  administration. CONTRAST:  39mL OMNIPAQUE IOHEXOL 300 MG/ML  SOLN COMPARISON:  07/27/2019. FINDINGS: Cardiovascular: Mild aortic atherosclerosis. No significant vascular findings. Normal heart size. No pericardial effusion. Mediastinum/Nodes: Normal appearance of the thyroid gland. The trachea appears patent and is midline. Normal appearance of the esophagus. Unchanged appearance of prominent mediastinal lymph nodes which measure up to 1.1 cm. These do not meet CT criteria for adenopathy. Lungs/Pleura: Linear parenchymal bands are within both lower lobes, lingula and right middle lobe, compatible with postinflammatory change. Signs of previous left upper lobectomy. No findings to suggest local tumor recurrence. No suspicious pulmonary nodule or mass identified. Upper Abdomen: Multiple regenerative splenic nodules are again noted within the left upper quadrant of the abdomen. No acute abnormality identified. Musculoskeletal: Spondylosis identified within the thoracic spine. No acute or suspicious osseous abnormalities. Remote healed right lateral rib fracture deformities are again noted. IMPRESSION: 1. Status post left upper lobectomy. No findings to suggest local tumor recurrence or metastatic disease. 2. Similar appearance of prominent mediastinal lymph nodes which do not meet CT criteria for adenopathy. 3.  Aortic Atherosclerosis (ICD10-I70.0). Electronically Signed   By: Kerby Moors M.D.   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 The patient is currently on observation and she is feeling fine with no concerning complaints. She had repeat CT scan of the chest performed recently.  I personally and independently reviewed the scans and discussed the results with the patient today. Her scan showed no  concerning findings for disease recurrence or metastasis. I recommended for her to continue on observation with repeat CT scan of the chest in 1 year. She was advised to call immediately if she has any concerning symptoms in the interval. The patient voices understanding of current disease status and treatment options and is in agreement with the current care plan.  All questions were answered. The patient knows to call the clinic with any problems, questions or concerns. We can certainly see the patient much sooner if necessary.  Disclaimer: This note was dictated with voice recognition software. Similar sounding words can inadvertently be transcribed and may not be corrected upon review.

## 2020-07-22 ENCOUNTER — Telehealth: Payer: Self-pay | Admitting: Internal Medicine

## 2020-07-22 NOTE — Telephone Encounter (Signed)
Scheduled appt per 10/13 los - mailed reminder letter with appt date and time

## 2020-10-06 DIAGNOSIS — M810 Age-related osteoporosis without current pathological fracture: Secondary | ICD-10-CM | POA: Diagnosis not present

## 2020-10-06 DIAGNOSIS — I700 Atherosclerosis of aorta: Secondary | ICD-10-CM | POA: Diagnosis not present

## 2020-10-14 DIAGNOSIS — Z6832 Body mass index (BMI) 32.0-32.9, adult: Secondary | ICD-10-CM | POA: Diagnosis not present

## 2020-10-14 DIAGNOSIS — Z85118 Personal history of other malignant neoplasm of bronchus and lung: Secondary | ICD-10-CM | POA: Diagnosis not present

## 2020-10-14 DIAGNOSIS — F3341 Major depressive disorder, recurrent, in partial remission: Secondary | ICD-10-CM | POA: Diagnosis not present

## 2020-10-14 DIAGNOSIS — I69998 Other sequelae following unspecified cerebrovascular disease: Secondary | ICD-10-CM | POA: Diagnosis not present

## 2020-10-14 DIAGNOSIS — R413 Other amnesia: Secondary | ICD-10-CM | POA: Diagnosis not present

## 2020-10-14 DIAGNOSIS — E6609 Other obesity due to excess calories: Secondary | ICD-10-CM | POA: Diagnosis not present

## 2020-10-14 DIAGNOSIS — Z9081 Acquired absence of spleen: Secondary | ICD-10-CM | POA: Diagnosis not present

## 2020-10-14 DIAGNOSIS — G0490 Encephalitis and encephalomyelitis, unspecified: Secondary | ICD-10-CM | POA: Diagnosis not present

## 2020-10-14 DIAGNOSIS — G40219 Localization-related (focal) (partial) symptomatic epilepsy and epileptic syndromes with complex partial seizures, intractable, without status epilepticus: Secondary | ICD-10-CM | POA: Diagnosis not present

## 2020-10-14 DIAGNOSIS — M810 Age-related osteoporosis without current pathological fracture: Secondary | ICD-10-CM | POA: Diagnosis not present

## 2020-10-14 DIAGNOSIS — I700 Atherosclerosis of aorta: Secondary | ICD-10-CM | POA: Diagnosis not present

## 2020-10-14 DIAGNOSIS — Z0000 Encounter for general adult medical examination without abnormal findings: Secondary | ICD-10-CM | POA: Diagnosis not present

## 2020-10-17 DIAGNOSIS — R82998 Other abnormal findings in urine: Secondary | ICD-10-CM | POA: Diagnosis not present

## 2020-10-21 ENCOUNTER — Other Ambulatory Visit: Payer: Self-pay | Admitting: Internal Medicine

## 2020-10-21 DIAGNOSIS — Z1231 Encounter for screening mammogram for malignant neoplasm of breast: Secondary | ICD-10-CM

## 2020-10-26 DIAGNOSIS — Z1212 Encounter for screening for malignant neoplasm of rectum: Secondary | ICD-10-CM | POA: Diagnosis not present

## 2020-12-05 ENCOUNTER — Ambulatory Visit: Payer: Medicare Other

## 2021-01-04 ENCOUNTER — Ambulatory Visit: Payer: Medicare Other | Admitting: Neurology

## 2021-01-13 MED ORDER — LACOSAMIDE 200 MG PO TABS: 200.0000 mg | ORAL_TABLET | Freq: Two times a day (BID) | ORAL | 0 refills | Status: DC

## 2021-01-16 ENCOUNTER — Telehealth: Payer: Self-pay | Admitting: Neurology

## 2021-01-16 MED ORDER — LACOSAMIDE 200 MG PO TABS: 200.0000 mg | ORAL_TABLET | Freq: Two times a day (BID) | ORAL | 3 refills | Status: DC

## 2021-01-16 NOTE — Telephone Encounter (Signed)
I don't have the form. I sent an emergency prescription to the local pharmacy on last week. Just confirming that now they need the 90-day for [facility]? I can send electronically.

## 2021-01-16 NOTE — Telephone Encounter (Signed)
[facility] sent [facility], thanks

## 2021-01-16 NOTE — Telephone Encounter (Signed)
Form should be on your desk to sign for [facility] to resend Vimpat 200 mg that was lost.

## 2021-01-16 NOTE — Telephone Encounter (Signed)
Left a VM stating that she needed to speak to someone about a medication and a fax

## 2021-01-17 ENCOUNTER — Ambulatory Visit
Admission: RE | Admit: 2021-01-17 | Discharge: 2021-01-17 | Disposition: A | Discharge status: Home / Self Care | Payer: Medicare Other | Source: Ambulatory Visit | Attending: Internal Medicine | Admitting: Internal Medicine

## 2021-01-17 ENCOUNTER — Other Ambulatory Visit: Payer: Self-pay

## 2021-01-17 DIAGNOSIS — Z1231 Encounter for screening mammogram for malignant neoplasm of breast: Secondary | ICD-10-CM | POA: Diagnosis not present

## 2021-01-19 ENCOUNTER — Other Ambulatory Visit: Payer: Self-pay | Admitting: Internal Medicine

## 2021-01-19 DIAGNOSIS — R928 Other abnormal and inconclusive findings on diagnostic imaging of breast: Secondary | ICD-10-CM

## 2021-02-08 ENCOUNTER — Ambulatory Visit
Admission: RE | Admit: 2021-02-08 | Discharge: 2021-02-08 | Disposition: A | Discharge status: Home / Self Care | Payer: Medicare Other | Source: Ambulatory Visit | Attending: Internal Medicine | Admitting: Internal Medicine

## 2021-02-08 ENCOUNTER — Other Ambulatory Visit: Payer: Self-pay

## 2021-02-08 ENCOUNTER — Other Ambulatory Visit: Payer: Self-pay | Admitting: Internal Medicine

## 2021-02-08 DIAGNOSIS — N6311 Unspecified lump in the right breast, upper outer quadrant: Secondary | ICD-10-CM | POA: Diagnosis not present

## 2021-02-08 DIAGNOSIS — R928 Other abnormal and inconclusive findings on diagnostic imaging of breast: Secondary | ICD-10-CM

## 2021-02-08 DIAGNOSIS — R922 Inconclusive mammogram: Secondary | ICD-10-CM | POA: Diagnosis not present

## 2021-03-17 ENCOUNTER — Other Ambulatory Visit: Payer: Self-pay

## 2021-03-17 ENCOUNTER — Encounter: Payer: Self-pay | Admitting: Neurology

## 2021-03-17 ENCOUNTER — Ambulatory Visit (INDEPENDENT_AMBULATORY_CARE_PROVIDER_SITE_OTHER): Payer: Medicare Other | Admitting: Neurology

## 2021-03-17 VITALS — BP 137/83 | HR 64 | Ht 64.0 in | Wt 196.8 lb

## 2021-03-17 DIAGNOSIS — G40219 Localization-related (focal) (partial) symptomatic epilepsy and epileptic syndromes with complex partial seizures, intractable, without status epilepticus: Secondary | ICD-10-CM | POA: Diagnosis not present

## 2021-03-17 DIAGNOSIS — R251 Tremor, unspecified: Secondary | ICD-10-CM

## 2021-03-17 MED ORDER — LEVETIRACETAM ER 500 MG PO TB24: ORAL_TABLET | ORAL | 3 refills | Status: DC

## 2021-03-17 NOTE — Patient Instructions (Signed)
Always good to see you! Have a happy birthday!  Continue all your medications  2. Follow-up in 6-8 months, call for any changes   Seizure Precautions: 1. If medication has been prescribed for you to prevent seizures, take it exactly as directed.  Do not stop taking the medicine without talking to your doctor first, even if you have not had a seizure in a long time.   2. Avoid activities in which a seizure would cause danger to yourself or to others.  Don't operate dangerous machinery, swim alone, or climb in high or dangerous places, such as on ladders, roofs, or girders.  Do not drive unless your doctor says you may.  3. If you have any warning that you may have a seizure, lay down in a safe place where you can't hurt yourself.    4.  No driving for 6 months from last seizure, as per [facility].   Please refer to the following link on the [place] website for more information: http://[email or web address]/[email or web address]/driving/drivingu.cfm   5.  Maintain good sleep hygiene. Avoid alcohol.  6.  Contact your doctor if you have any problems that may be related to the medicine you are taking.  7.  Call 911 and bring the patient back to the ED if:        A.  The seizure lasts longer than 5 minutes.       B.  The patient doesn't awaken shortly after the seizure  C.  The patient has new problems such as difficulty seeing, speaking or moving  D.  The patient was injured during the seizure  E.  The patient has a temperature over 102 F (39C)  F.  The patient vomited and now is having trouble breathing

## 2021-03-17 NOTE — Progress Notes (Signed)
NEUROLOGY FOLLOW UP OFFICE NOTE  Mallory Boone [id number] [birth year]  HISTORY OF PRESENT ILLNESS: I had the pleasure of seeing Mallory Boone in follow-up in the neurology clinic on 03/17/2021.  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 She is again accompanied by her husband who helps supplement the history today.  Records and images were personally reviewed where available.  Since her last visit, they report that she continues to do well, symptoms stable. She has been seizure-free since 2020 on Vimpat 200mg  BID and Keppra XR 500mg  4 tabs qhs without side effects. She manages her own medications. She has not needed the prn lorazepam. She denies any headaches, dizziness, vision changes, no falls. Sleep is good. She has a resting left hand tremor again noted today which she herself does not notice. It does not affect activities. Her husband has not noticed any gait changes. Sleep is good.    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 She is amnestic of her seizures with no prior warning symptoms. Her husband describes stereotyped episodes where she would start rocking and grimacing, perseverating repeatedly saying "I'm okay, it's okay" for 10 seconds. She would be disoriented after, saying "I'm hot, I'm cold, I need to use the bathroom, where is it?" Her husband feels they occur more when she is slowing down or when it is quiet (such as in church). He has not seen any nocturnal seizures. She has only had 2 convulsions when she was initially diagnosed with encephalitis in 1996, none since then. He recalls her trying Dilantin and Lamictal in the past. She has been on Keppra XR 2000mg  daily for at least 10 years, Vimpat 100mg  BID was added on 18 months ago. She was having 2-3 seizures a day, and had a significant  reduction with addition of Vimpat. Over the past 6 months, her husband has noticed she would sometimes do a little pedaling of both feet. Her husband reports an average of 2 seizures a week, last seizure was 2 days ago. No associated tongue bite or incontinence. No side effects on medications. She denies any olfactory/gustatory hallucinations, deja vu, rising epigastric sensation, focal numbness/tingling/weakness, myoclonic jerks. She denies any headaches, dizziness, diplopia, dysarthria/dysphagia, neck/back pain, bowel dysfunction. She has urinary frequency. Her hands feels stiff sometimes. She has had cognitive issues since the encephalitis, she does not drive. Her husband manages finances. She has to write things down in a journal daily. She manages her own medications. She is independent with dressing and bathing. They moved to [place] 3 months ago to be closer to family. She said they moved a year ago. Her mother had Alzheimer's disease, her father had Lewy Body dementia.   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  EEG in 06/2018 showed sharp transients in sleep in the right frontal region with phase reversal at F8.    Epilepsy Risk Factors:  Encephalitis in 1996. Otherwise she had a normal birth and early development.  There is no history of febrile convulsions, significant traumatic brain injury, neurosurgical procedures, or family history of seizures.   Prior AEDs: Dilantin, lamictal   PAST MEDICAL HISTORY: Past  Medical History:  Diagnosis Date   Depression    Encephalitis    Osteoporosis    Seizures ([place])     MEDICATIONS: Current Outpatient Medications on File Prior to Visit  Medication Sig Dispense Refill   alendronate (FOSAMAX) 70 MG tablet Take 70  mg by mouth once a week. Take with a full glass of water on an empty stomach.     calcium-vitamin D (OSCAL WITH D) 500-200 MG-UNIT tablet Take 1 tablet by mouth.     lacosamide (VIMPAT) 200 MG TABS tablet Take 1 tablet (200 mg total) by mouth 2 (two) times daily. 180 tablet 3   levETIRAcetam (KEPPRA XR) 500 MG 24 hr tablet Take 4 tablets every night 360 tablet 3   LORazepam (ATIVAN) 1 MG tablet Take 1 tablet as needed for cluster of seizures. 10 tablet 5   Multiple Vitamins-Minerals (MULTIVITAMIN WITH MINERALS) tablet Take 1 tablet by mouth daily.     Vilazodone HCl 20 MG TABS Take 20 mg by mouth daily.     No current facility-administered medications on file prior to visit.    ALLERGIES: Allergies  Allergen Reactions   Ampicillin Rash    Red maculopapular rash occurred in context of coadministration of ampicillin, vancomycin, and ceftriaxone    FAMILY HISTORY: History reviewed. No pertinent family history.  SOCIAL HISTORY: Social History   Socioeconomic History   Marital status: Married    Spouse name: Not on file   Number of children: Not on file   Years of education: College   Highest education level: Not on file  Occupational History   Not on file  Tobacco Use   Smoking status: Never   Smokeless tobacco: Never  Vaping Use   Vaping Use: Not on file  Substance and Sexual Activity   Alcohol use: Never   Drug use: Never   Sexual activity: Yes    Birth control/protection: Post-menopausal  Other Topics Concern   Not on file  Social History Narrative   Right handed      Lives with husband      Secretary/administrator edu   Social Determinants of Health   Financial Resource Strain: Not on file  Food Insecurity: Not on file  Transportation Needs: Not on file  Physical Activity: Not on file  Stress: Not on file  Social Connections: Not on file  Intimate Partner Violence: Not on file     PHYSICAL EXAM: Vitals:   03/17/21 1306  BP: 137/83  Pulse: 64  SpO2: 97%   General:  No acute distress Head:  Normocephalic/atraumatic Skin/Extremities: No rash, no edema Neurological Exam: alert and awake. No aphasia or dysarthria. Fund of knowledge is appropriate.  Attention and concentration are normal.   Cranial nerves: Pupils equal, round. Extraocular movements intact with no nystagmus. Visual fields full.  No facial asymmetry.  Motor: mild cogwheeling on left wrist. Muscle strength 5/5 throughout with no pronator drift.   Finger to nose testing intact.  Gait: able to rise from chair with arms crossed over chest, gait wide-based, no ataxia, fair arm swing. She has again has a left hand resting tremor today. No postural or action tremor. Negative postural instability.   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 No further seizures since 2020 on Vimpat 200mg  BID and Keppra XR 500mg  4 tabs qhs. She has not needed prn lorazepam for seizure rescue. She again has a left resting tremor but no other parkinsonian signs, continue to monitor. She does not drive. Follow-up in 6-8 months, they know to call for any changes.     Thank you for allowing me to participate in her care.  Please do not hesitate to call for any questions or concerns.   Ellouise Newer, M.D.   CC: Trainer

## 2021-05-01 DIAGNOSIS — G0490 Encephalitis and encephalomyelitis, unspecified: Secondary | ICD-10-CM | POA: Diagnosis not present

## 2021-05-01 DIAGNOSIS — G40219 Localization-related (focal) (partial) symptomatic epilepsy and epileptic syndromes with complex partial seizures, intractable, without status epilepticus: Secondary | ICD-10-CM | POA: Diagnosis not present

## 2021-05-01 DIAGNOSIS — Z85118 Personal history of other malignant neoplasm of bronchus and lung: Secondary | ICD-10-CM | POA: Diagnosis not present

## 2021-05-01 DIAGNOSIS — E6609 Other obesity due to excess calories: Secondary | ICD-10-CM | POA: Diagnosis not present

## 2021-05-01 DIAGNOSIS — Z9081 Acquired absence of spleen: Secondary | ICD-10-CM | POA: Diagnosis not present

## 2021-05-01 DIAGNOSIS — Z1331 Encounter for screening for depression: Secondary | ICD-10-CM | POA: Diagnosis not present

## 2021-05-01 DIAGNOSIS — I700 Atherosclerosis of aorta: Secondary | ICD-10-CM | POA: Diagnosis not present

## 2021-05-01 DIAGNOSIS — I69998 Other sequelae following unspecified cerebrovascular disease: Secondary | ICD-10-CM | POA: Diagnosis not present

## 2021-05-01 DIAGNOSIS — M810 Age-related osteoporosis without current pathological fracture: Secondary | ICD-10-CM | POA: Diagnosis not present

## 2021-05-01 DIAGNOSIS — F3341 Major depressive disorder, recurrent, in partial remission: Secondary | ICD-10-CM | POA: Diagnosis not present

## 2021-05-01 DIAGNOSIS — R413 Other amnesia: Secondary | ICD-10-CM | POA: Diagnosis not present

## 2021-05-19 DIAGNOSIS — D2272 Melanocytic nevi of left lower limb, including hip: Secondary | ICD-10-CM | POA: Diagnosis not present

## 2021-05-19 DIAGNOSIS — L814 Other melanin hyperpigmentation: Secondary | ICD-10-CM | POA: Diagnosis not present

## 2021-05-19 DIAGNOSIS — D1801 Hemangioma of skin and subcutaneous tissue: Secondary | ICD-10-CM | POA: Diagnosis not present

## 2021-05-19 DIAGNOSIS — L578 Other skin changes due to chronic exposure to nonionizing radiation: Secondary | ICD-10-CM | POA: Diagnosis not present

## 2021-05-19 DIAGNOSIS — D225 Melanocytic nevi of trunk: Secondary | ICD-10-CM | POA: Diagnosis not present

## 2021-05-19 DIAGNOSIS — L821 Other seborrheic keratosis: Secondary | ICD-10-CM | POA: Diagnosis not present

## 2021-05-19 DIAGNOSIS — Q825 Congenital non-neoplastic nevus: Secondary | ICD-10-CM | POA: Diagnosis not present

## 2021-07-18 ENCOUNTER — Inpatient Hospital Stay: Payer: Medicare Other | Attending: Internal Medicine

## 2021-07-18 ENCOUNTER — Other Ambulatory Visit: Payer: Self-pay

## 2021-07-18 DIAGNOSIS — C3490 Malignant neoplasm of unspecified part of unspecified bronchus or lung: Secondary | ICD-10-CM

## 2021-07-18 DIAGNOSIS — Z85110 Personal history of malignant carcinoid tumor of bronchus and lung: Secondary | ICD-10-CM | POA: Insufficient documentation

## 2021-07-18 LAB — CBC WITH DIFFERENTIAL (CANCER CENTER ONLY)
Abs Immature Granulocytes: 0.02 10*3/uL (ref 0.00–0.07)
Basophils Absolute: 0.1 10*3/uL (ref 0.0–0.1)
Basophils Relative: 1 %
Eosinophils Absolute: 0.4 10*3/uL (ref 0.0–0.5)
Eosinophils Relative: 4 %
HCT: 42.3 % (ref 36.0–46.0)
Hemoglobin: 14 g/dL (ref 12.0–15.0)
Immature Granulocytes: 0 %
Lymphocytes Relative: 34 %
Lymphs Abs: 3.1 10*3/uL (ref 0.7–4.0)
MCH: 30.2 pg (ref 26.0–34.0)
MCHC: 33.1 g/dL (ref 30.0–36.0)
MCV: 91.4 fL (ref 80.0–100.0)
Monocytes Absolute: 1.1 10*3/uL — ABNORMAL HIGH (ref 0.1–1.0)
Monocytes Relative: 12 %
Neutro Abs: 4.2 10*3/uL (ref 1.7–7.7)
Neutrophils Relative %: 49 %
Platelet Count: 335 10*3/uL (ref 150–400)
RBC: 4.63 MIL/uL (ref 3.87–5.11)
RDW: 13.1 % (ref 11.5–15.5)
WBC Count: 8.9 10*3/uL (ref 4.0–10.5)
nRBC: 0 % (ref 0.0–0.2)

## 2021-07-18 LAB — CMP (CANCER CENTER ONLY)
ALT: 12 U/L (ref 0–44)
AST: 17 U/L (ref 15–41)
Albumin: 3.5 g/dL (ref 3.5–5.0)
Alkaline Phosphatase: 77 U/L (ref 38–126)
Anion gap: 9 (ref 5–15)
BUN: 15 mg/dL (ref 8–23)
CO2: 25 mmol/L (ref 22–32)
Calcium: 9.4 mg/dL (ref 8.9–10.3)
Chloride: 105 mmol/L (ref 98–111)
Creatinine: 0.78 mg/dL (ref 0.44–1.00)
GFR, Estimated: >60 mL/min (ref >60)
Glucose, Bld: 129 mg/dL — ABNORMAL HIGH (ref 70–99)
Potassium: 4.3 mmol/L (ref 3.5–5.1)
Sodium: 139 mmol/L (ref 135–145)
Total Bilirubin: 0.5 mg/dL (ref 0.3–1.2)
Total Protein: 7.6 g/dL (ref 6.5–8.1)

## 2021-07-19 ENCOUNTER — Ambulatory Visit (HOSPITAL_COMMUNITY)
Admission: RE | Admit: 2021-07-19 | Discharge: 2021-07-19 | Disposition: A | Discharge status: Home / Self Care | Payer: Medicare Other | Source: Ambulatory Visit | Attending: Internal Medicine | Admitting: Internal Medicine

## 2021-07-19 DIAGNOSIS — I700 Atherosclerosis of aorta: Secondary | ICD-10-CM | POA: Diagnosis not present

## 2021-07-19 DIAGNOSIS — R079 Chest pain, unspecified: Secondary | ICD-10-CM | POA: Diagnosis not present

## 2021-07-19 DIAGNOSIS — C3490 Malignant neoplasm of unspecified part of unspecified bronchus or lung: Secondary | ICD-10-CM | POA: Insufficient documentation

## 2021-07-19 MED ORDER — IOHEXOL 350 MG/ML SOLN
60.0000 mL | Freq: Once | INTRAVENOUS | Status: AC | PRN
  Administered 2021-07-19: 60 mL via INTRAVENOUS

## 2021-07-20 ENCOUNTER — Other Ambulatory Visit: Payer: Self-pay

## 2021-07-20 ENCOUNTER — Inpatient Hospital Stay (HOSPITAL_BASED_OUTPATIENT_CLINIC_OR_DEPARTMENT_OTHER): Payer: Medicare Other | Admitting: Internal Medicine

## 2021-07-20 VITALS — BP 146/84 | HR 76 | Temp 97.3°F | Resp 20 | Ht 64.0 in | Wt 196.3 lb

## 2021-07-20 DIAGNOSIS — C3490 Malignant neoplasm of unspecified part of unspecified bronchus or lung: Secondary | ICD-10-CM | POA: Diagnosis not present

## 2021-07-20 DIAGNOSIS — Z85110 Personal history of malignant carcinoid tumor of bronchus and lung: Secondary | ICD-10-CM | POA: Diagnosis not present

## 2021-07-20 DIAGNOSIS — D3A090 Benign carcinoid tumor of the bronchus and lung: Secondary | ICD-10-CM | POA: Diagnosis not present

## 2021-07-20 NOTE — Progress Notes (Signed)
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   PRIOR THERAPY: Status post left upper lobectomy with lymph node dissection.  CURRENT THERAPY: Observation.  INTERVAL HISTORY: Mallory Boone 75 y.o. female returns to the clinic today for follow-up visit.  The patient is feeling fine today with no concerning complaints.  She denied having any current chest pain, shortness of breath, cough or hemoptysis.  She denied having any fever or chills.  She has no nausea, vomiting, diarrhea or constipation.  She has no significant weight loss or night sweats.  She had repeat CT scan of the chest performed yesterday and she is here for evaluation and discussion of her risk her results.  MEDICAL HISTORY: Past Medical History:  Diagnosis Date   Depression    Encephalitis    Osteoporosis    Seizures ([facility])     ALLERGIES:  is allergic to ampicillin.  MEDICATIONS:  Current Outpatient Medications  Medication Sig Dispense Refill   alendronate (FOSAMAX) 70 MG tablet Take 70 mg by mouth once a week. Take with a full glass of water on an empty stomach.     calcium-vitamin D (OSCAL WITH D) 500-200 MG-UNIT tablet Take 1 tablet by mouth.     lacosamide (VIMPAT) 200 MG TABS tablet Take 1 tablet (200 mg total) by mouth 2 (two) times daily. 180 tablet 3   levETIRAcetam (KEPPRA XR) 500 MG 24 hr tablet Take 4 tablets every night 360 tablet 3   LORazepam (ATIVAN) 1 MG tablet Take 1 tablet as needed for cluster of seizures. 10 tablet 5   Multiple Vitamins-Minerals (MULTIVITAMIN WITH MINERALS) tablet Take 1 tablet by mouth daily.     Vilazodone HCl 20 MG TABS Take 20 mg by mouth daily.     No current facility-administered medications for this visit.    SURGICAL HISTORY:  Past  Surgical History:  Procedure Laterality Date   ABDOMINAL HYSTERECTOMY     left lobe lobectomy     SPLENECTOMY, TOTAL      REVIEW OF SYSTEMS:  A comprehensive review of systems was negative.   PHYSICAL EXAMINATION: General appearance: alert, cooperative, and no distress Head: Normocephalic, without obvious abnormality, atraumatic Neck: no adenopathy, no JVD, supple, symmetrical, trachea midline, and thyroid not enlarged, symmetric, no tenderness/mass/nodules Lymph nodes: Cervical, supraclavicular, and axillary nodes normal. Resp: clear to auscultation bilaterally Back: symmetric, no curvature. ROM normal. No CVA tenderness. Cardio: regular rate and rhythm, S1, S2 normal, no murmur, click, rub or gallop GI: soft, non-tender; bowel sounds normal; no masses,  no organomegaly Extremities: extremities normal, atraumatic, no cyanosis or edema  ECOG PERFORMANCE STATUS: 1 - Symptomatic but completely ambulatory  Blood pressure (!) 146/84, pulse 76, temperature (!) 97.3 F (36.3 C), temperature source Tympanic, resp. rate 20, height 5\' 4"  (1.626 m), weight 196 lb 4.8 oz (89 kg), SpO2 97 %.  LABORATORY DATA: Lab Results  Component Value Date   WBC 8.9 07/18/2021   HGB 14.0 07/18/2021   HCT 42.3 07/18/2021   MCV 91.4 07/18/2021   PLT 335 07/18/2021      Chemistry      Component Value Date/Time   NA 139 07/18/2021 0936   K 4.3 07/18/2021 0936   CL 105 07/18/2021 0936   CO2 25 07/18/2021 0936   BUN 15  07/18/2021 0936   BUN 12 05/27/2019 0000   CREATININE 0.78 07/18/2021 0936      Component Value Date/Time   CALCIUM 9.4 07/18/2021 0936   ALKPHOS 77 07/18/2021 0936   AST 17 07/18/2021 0936   ALT 12 07/18/2021 0936   BILITOT 0.5 07/18/2021 0936       RADIOGRAPHIC STUDIES: No results found.   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 The patient is currently on observation and she is feeling fine today with no concerning complaints. She had repeat CT scan of the chest performed yesterday.  I personally and independently reviewed the scan images as the final report is still pending. I do not see any concerning findings for progression but we will wait for the final report for confirmation. If there is any concerning evidence for disease progression, I will see the patient back for follow-up visit in 1 year with repeat CT scan of the chest. She was advised to call immediately if she has any concerning symptoms in the interval.  The patient voices understanding of current disease status and treatment options and is in agreement with the current care plan.  All questions were answered. The patient knows to call the clinic with any problems, questions or concerns. We can certainly see the patient much sooner if necessary.  Disclaimer: This note was dictated with voice recognition software. Similar sounding words can inadvertently be transcribed and may not be corrected upon review.

## 2021-07-24 ENCOUNTER — Telehealth: Payer: Self-pay | Admitting: Internal Medicine

## 2021-07-24 NOTE — Telephone Encounter (Signed)
Scheduled follow-up appointments per 10/13 los. Patient's husband is aware.

## 2021-08-14 DIAGNOSIS — Z20822 Contact with and (suspected) exposure to covid-19: Secondary | ICD-10-CM | POA: Diagnosis not present

## 2021-08-16 ENCOUNTER — Other Ambulatory Visit: Payer: Medicare Other

## 2021-08-23 ENCOUNTER — Other Ambulatory Visit: Payer: Self-pay

## 2021-08-23 ENCOUNTER — Ambulatory Visit
Admission: RE | Admit: 2021-08-23 | Discharge: 2021-08-23 | Disposition: A | Discharge status: Home / Self Care | Payer: Medicare Other | Source: Ambulatory Visit | Attending: Internal Medicine | Admitting: Internal Medicine

## 2021-08-23 ENCOUNTER — Other Ambulatory Visit: Payer: Self-pay | Admitting: Internal Medicine

## 2021-08-23 DIAGNOSIS — R928 Other abnormal and inconclusive findings on diagnostic imaging of breast: Secondary | ICD-10-CM

## 2021-08-23 DIAGNOSIS — N6310 Unspecified lump in the right breast, unspecified quadrant: Secondary | ICD-10-CM

## 2021-08-23 DIAGNOSIS — N6311 Unspecified lump in the right breast, upper outer quadrant: Secondary | ICD-10-CM | POA: Diagnosis not present

## 2021-10-10 ENCOUNTER — Other Ambulatory Visit: Payer: Self-pay

## 2021-10-10 ENCOUNTER — Encounter: Payer: Self-pay | Admitting: Neurology

## 2021-10-10 ENCOUNTER — Ambulatory Visit (INDEPENDENT_AMBULATORY_CARE_PROVIDER_SITE_OTHER): Payer: Medicare Other | Admitting: Neurology

## 2021-10-10 VITALS — BP 160/86 | HR 73 | Ht 66.0 in | Wt 191.8 lb

## 2021-10-10 DIAGNOSIS — G40219 Localization-related (focal) (partial) symptomatic epilepsy and epileptic syndromes with complex partial seizures, intractable, without status epilepticus: Secondary | ICD-10-CM | POA: Diagnosis not present

## 2021-10-10 DIAGNOSIS — R251 Tremor, unspecified: Secondary | ICD-10-CM

## 2021-10-10 MED ORDER — LORAZEPAM 1 MG PO TABS: ORAL_TABLET | ORAL | 5 refills | Status: AC

## 2021-10-10 MED ORDER — LACOSAMIDE 200 MG PO TABS: 200.0000 mg | ORAL_TABLET | Freq: Two times a day (BID) | ORAL | 3 refills | Status: DC

## 2021-10-10 MED ORDER — LEVETIRACETAM ER 500 MG PO TB24
ORAL_TABLET | ORAL | 3 refills | Status: DC
Start: 2021-10-10 — End: 2022-05-15

## 2021-10-10 NOTE — Patient Instructions (Signed)
Always good to see you! Continue all your medications. If balance worsens, please contact our office and we will plan for physical therapy. Follow-up in 6-8 months, call for any changes.    Seizure Precautions: 1. If medication has been prescribed for you to prevent seizures, take it exactly as directed.  Do not stop taking the medicine without talking to your doctor first, even if you have not had a seizure in a long time.   2. Avoid activities in which a seizure would cause danger to yourself or to others.  Don't operate dangerous machinery, swim alone, or climb in high or dangerous places, such as on ladders, roofs, or girders.  Do not drive unless your doctor says you may.  3. If you have any warning that you may have a seizure, lay down in a safe place where you can't hurt yourself.    4.  No driving for 6 months from last seizure, as per [facility].   Please refer to the following link on the [place] website for more information: http://[email or web address].org/answerplace/Social/driving/drivingu.cfm   5.  Maintain good sleep hygiene. Avoid alcohol.  6.  Contact your doctor if you have any problems that may be related to the medicine you are taking.  7.  Call 911 and bring the patient back to the ED if:        A.  The seizure lasts longer than 5 minutes.       B.  The patient doesn't awaken shortly after the seizure  C.  The patient has new problems such as difficulty seeing, speaking or moving  D.  The patient was injured during the seizure  E.  The patient has a temperature over 102 F (39C)  F.  The patient vomited and now is having trouble breathing

## 2021-10-10 NOTE — Progress Notes (Signed)
NEUROLOGY FOLLOW UP OFFICE NOTE  Mallory Boone [id number] July 01, 1946  HISTORY OF PRESENT ILLNESS: I had the pleasure of seeing Mallory Boone in follow-up in the neurology clinic on 10/10/2021.  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 She is again accompanied by her husband who helps supplement the history today.  They report that she has been doing well. There was one incident at the end of the October at bedtime where she was confused about going to the bathroom, not sure where it was. It lasted for a minute. Her husband did not notice any staring/unresponsiveness, no jerking movements. She denies any olfactory/gustatory hallucinations, focal numbness/tingling/weakness. No headaches, dizziness, vision changes, bowel/bladder dysfunction. She has a left resting tremor that they report is unchanged, does not affect activities. Her walking is "not any worse," no falls. Sleep is good, sometimes she has difficulty with sleep initiation. Mood is "splendid." She manages her own medications and denies missing any doses. She does not drive. She is on Lacosamide 200mg  BID and Keppra XR 500mg  4 tabs qhs without side effects. She has not needed prn lorazepam.    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 She is amnestic of her seizures with no prior warning symptoms. Her husband describes stereotyped episodes where she would start rocking and grimacing, perseverating repeatedly saying "I'm okay, it's okay" for 10 seconds. She would be disoriented after, saying "I'm hot, I'm cold, I need to use the bathroom, where is it?" Her husband feels they occur more when she is slowing down or when it is quiet (such as in church). He has not seen any nocturnal seizures. She has only had 2 convulsions when she was initially diagnosed  with encephalitis in 1996, none since then. He recalls her trying Dilantin and Lamictal in the past. She has been on Keppra XR 2000mg  daily for at least 10 years, Vimpat 100mg  BID was added on 18 months ago. She was having 2-3 seizures a day, and had a significant reduction with addition of Vimpat. Over the past 6 months, her husband has noticed she would sometimes do a little pedaling of both feet. Her husband reports an average of 2 seizures a week, last seizure was 2 days ago. No associated tongue bite or incontinence. No side effects on medications. She denies any olfactory/gustatory hallucinations, deja vu, rising epigastric sensation, focal numbness/tingling/weakness, myoclonic jerks. She denies any headaches, dizziness, diplopia, dysarthria/dysphagia, neck/back pain, bowel dysfunction. She has urinary frequency. Her hands feels stiff sometimes. She has had cognitive issues since the encephalitis, she does not drive. Her husband manages finances. She has to write things down in a journal daily. She manages her own medications. She is independent with dressing and bathing. They moved to [place] 3 months ago to be closer to family. She said they moved a year ago. Her mother had Alzheimer's disease, her father had Lewy Body dementia.   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  EEG in 06/2018 showed sharp transients in sleep in the right frontal region with phase reversal at F8.    Epilepsy Risk Factors:  Encephalitis in 1996. Otherwise she had  a normal birth and early development.  There is no history of febrile convulsions, significant traumatic brain injury, neurosurgical procedures, or family history of seizures.   Prior AEDs: Dilantin, lamictal   PAST MEDICAL  HISTORY: Past Medical History:  Diagnosis Date   Depression    Encephalitis    Osteoporosis    Seizures ([place])     MEDICATIONS: Current Outpatient Medications on File Prior to Visit  Medication Sig Dispense Refill   alendronate (FOSAMAX) 70 MG tablet Take 70 mg by mouth once a week. Take with a full glass of water on an empty stomach.     calcium-vitamin D (OSCAL WITH D) 500-200 MG-UNIT tablet Take 1 tablet by mouth.     lacosamide (VIMPAT) 200 MG TABS tablet Take 1 tablet (200 mg total) by mouth 2 (two) times daily. 180 tablet 3   levETIRAcetam (KEPPRA XR) 500 MG 24 hr tablet Take 4 tablets every night 360 tablet 3   LORazepam (ATIVAN) 1 MG tablet Take 1 tablet as needed for cluster of seizures. 10 tablet 5   Multiple Vitamins-Minerals (MULTIVITAMIN WITH MINERALS) tablet Take 1 tablet by mouth daily.     Vilazodone HCl 20 MG TABS Take 20 mg by mouth daily.     No current facility-administered medications on file prior to visit.    ALLERGIES: Allergies  Allergen Reactions   Ampicillin Rash    Red maculopapular rash occurred in context of coadministration of ampicillin, vancomycin, and ceftriaxone    FAMILY HISTORY: History reviewed. No pertinent family history.  SOCIAL HISTORY: Social History   Socioeconomic History   Marital status: Married    Spouse name: Not on file   Number of children: Not on file   Years of education: College   Highest education level: Not on file  Occupational History   Not on file  Tobacco Use   Smoking status: Never   Smokeless tobacco: Never  Vaping Use   Vaping Use: Never used  Substance and Sexual Activity   Alcohol use: Never   Drug use: Never   Sexual activity: Yes    Birth control/protection: Post-menopausal  Other Topics Concern   Not on file  Social History Narrative   Right handed      Lives with husband      Secretary/administrator edu   Social Determinants of Health   Financial Resource Strain: Not on file  Food Insecurity: Not on  file  Transportation Needs: Not on file  Physical Activity: Not on file  Stress: Not on file  Social Connections: Not on file  Intimate Partner Violence: Not on file     PHYSICAL EXAM: Vitals:   10/10/21 0816  BP: (!) 160/86  Pulse: 73  SpO2: 96%   General: No acute distress Head:  Normocephalic/atraumatic Skin/Extremities: No rash, no edema Neurological Exam: alert and awake. No aphasia or dysarthria. Fund of knowledge is appropriate.  Attention and concentration are normal.   Cranial nerves: Pupils equal, round. Extraocular movements intact with no nystagmus. Visual fields full.  No facial asymmetry.  Motor: +cogwheeling on left. muscle strength 5/5 throughout with no pronator drift.   Finger to nose testing intact.  Gait able to rise from chair with arms crossed over chest, gait wide-based with arms held to her sides, reduced arm swing with tremors in fingers on ambulation. +left hand resting tremor, no postural or action tremor. Good finger and foot taps. +postural instability.    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 She has been seizure-free since 2020 on Lacosamide 200mg  BID and Keppra XR 500mg  4 tabs qhs. She has prn lorazepam for seizure rescue but has not needed it. Refills sent for medications. She again has a left resting tremor which they report is unchanged, continue to monitor. Discussed monitoring balance, may consider balance therapy if needed. Follow-up in 6-8 months, call for any changes.    Thank you for allowing me to participate in her care.  Please do not hesitate to call for any questions or concerns.    Ellouise Newer, M.D.   CC: [place]

## 2021-10-27 DIAGNOSIS — I700 Atherosclerosis of aorta: Secondary | ICD-10-CM | POA: Diagnosis not present

## 2021-10-27 DIAGNOSIS — M810 Age-related osteoporosis without current pathological fracture: Secondary | ICD-10-CM | POA: Diagnosis not present

## 2021-11-03 DIAGNOSIS — Z0000 Encounter for general adult medical examination without abnormal findings: Secondary | ICD-10-CM | POA: Diagnosis not present

## 2021-11-03 DIAGNOSIS — Z1331 Encounter for screening for depression: Secondary | ICD-10-CM | POA: Diagnosis not present

## 2021-11-03 DIAGNOSIS — Z85118 Personal history of other malignant neoplasm of bronchus and lung: Secondary | ICD-10-CM | POA: Diagnosis not present

## 2021-11-03 DIAGNOSIS — Z9081 Acquired absence of spleen: Secondary | ICD-10-CM | POA: Diagnosis not present

## 2021-11-03 DIAGNOSIS — Z1212 Encounter for screening for malignant neoplasm of rectum: Secondary | ICD-10-CM | POA: Diagnosis not present

## 2021-11-03 DIAGNOSIS — I700 Atherosclerosis of aorta: Secondary | ICD-10-CM | POA: Diagnosis not present

## 2021-11-03 DIAGNOSIS — M810 Age-related osteoporosis without current pathological fracture: Secondary | ICD-10-CM | POA: Diagnosis not present

## 2021-11-03 DIAGNOSIS — R413 Other amnesia: Secondary | ICD-10-CM | POA: Diagnosis not present

## 2021-11-03 DIAGNOSIS — G0490 Encephalitis and encephalomyelitis, unspecified: Secondary | ICD-10-CM | POA: Diagnosis not present

## 2021-11-03 DIAGNOSIS — G40219 Localization-related (focal) (partial) symptomatic epilepsy and epileptic syndromes with complex partial seizures, intractable, without status epilepticus: Secondary | ICD-10-CM | POA: Diagnosis not present

## 2021-11-03 DIAGNOSIS — F3341 Major depressive disorder, recurrent, in partial remission: Secondary | ICD-10-CM | POA: Diagnosis not present

## 2021-11-03 DIAGNOSIS — R82998 Other abnormal findings in urine: Secondary | ICD-10-CM | POA: Diagnosis not present

## 2021-11-03 DIAGNOSIS — E6609 Other obesity due to excess calories: Secondary | ICD-10-CM | POA: Diagnosis not present

## 2021-11-03 DIAGNOSIS — Z1339 Encounter for screening examination for other mental health and behavioral disorders: Secondary | ICD-10-CM | POA: Diagnosis not present

## 2021-11-03 DIAGNOSIS — I69998 Other sequelae following unspecified cerebrovascular disease: Secondary | ICD-10-CM | POA: Diagnosis not present

## 2021-12-27 DIAGNOSIS — Z20822 Contact with and (suspected) exposure to covid-19: Secondary | ICD-10-CM | POA: Diagnosis not present

## 2021-12-29 DIAGNOSIS — Z20828 Contact with and (suspected) exposure to other viral communicable diseases: Secondary | ICD-10-CM | POA: Diagnosis not present

## 2021-12-29 IMAGING — CT CT CHEST W/ CM
2 of 4 series · 15 of 36 positions shown, 18 images · IV contrast (omnipaque)
Comparison: 07/18/2020

CLINICAL DATA: Chest pain, cough, non-small cell lung cancer
restaging

EXAM:
CT CHEST WITH CONTRAST
TECHNIQUE: Multidetector CT imaging of the chest was performed during
intravenous contrast administration.
CONTRAST:  60mL OMNIPAQUE IOHEXOL 350 MG/ML SOLN

[Series 2: axial st · axial · 0.68mm/px · z∈[-244,+36]mm · 12 of 166 slices shown, 15 images]
[im 13/166  mediastinal]
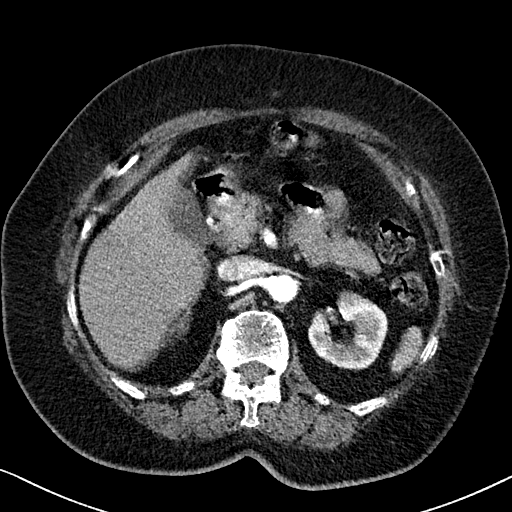
[im 13/166  lung]
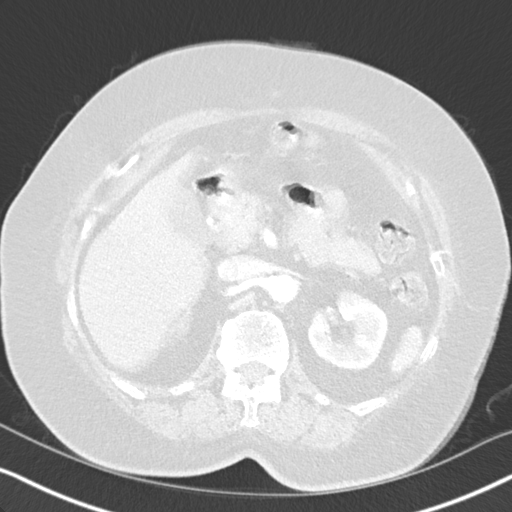
[im 26/166  lung]
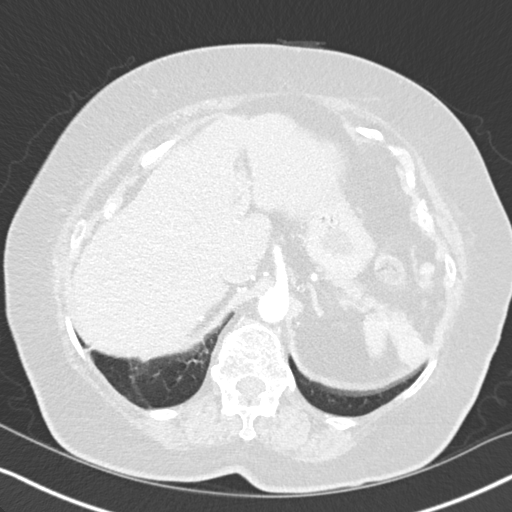
[im 39/166  lung]
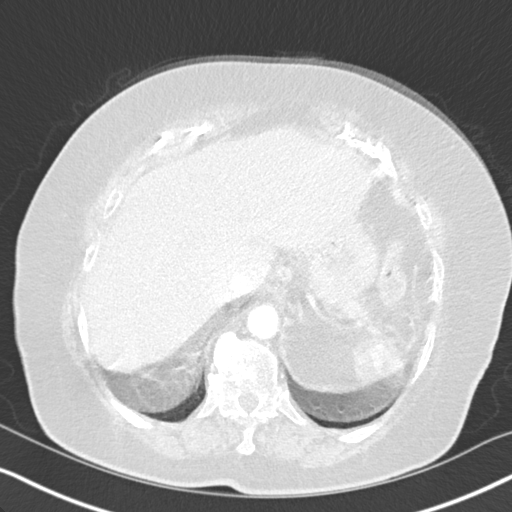
[im 51/166  lung]
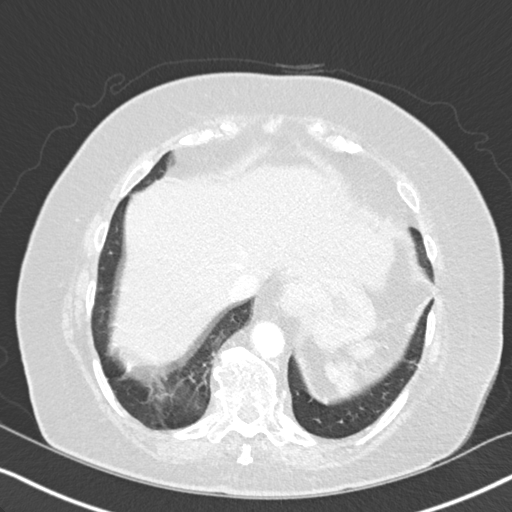
[im 64/166  mediastinal]
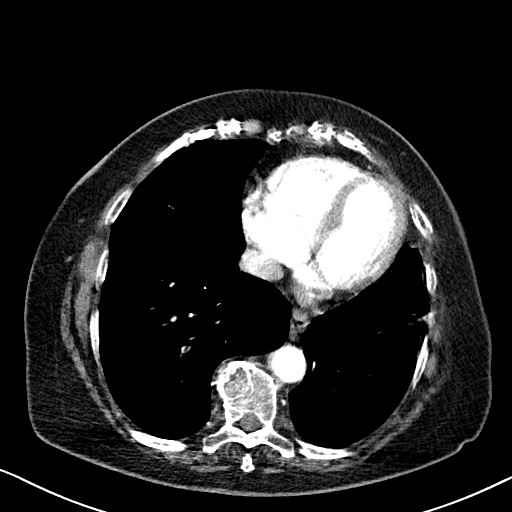
[im 64/166  lung]
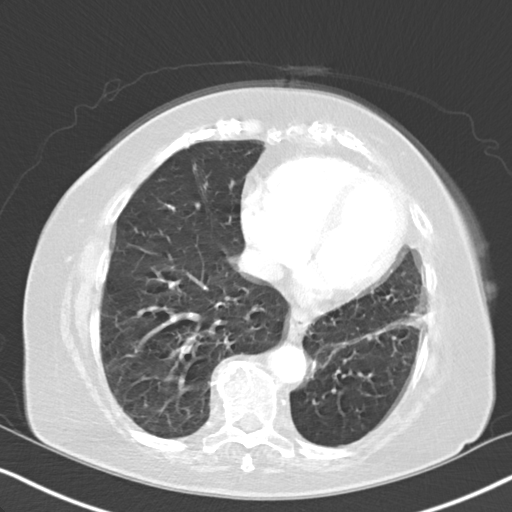
[im 77/166  lung]
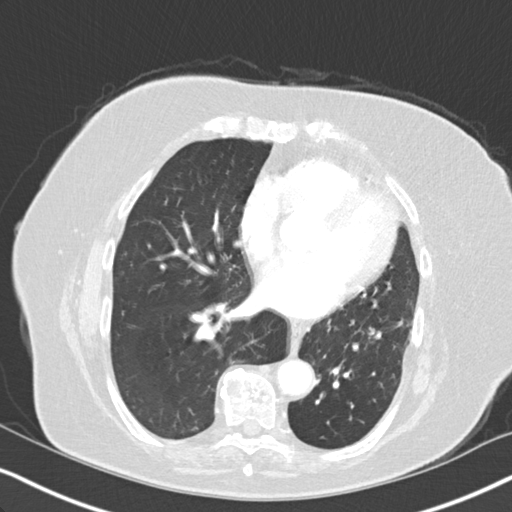
[im 89/166  lung]
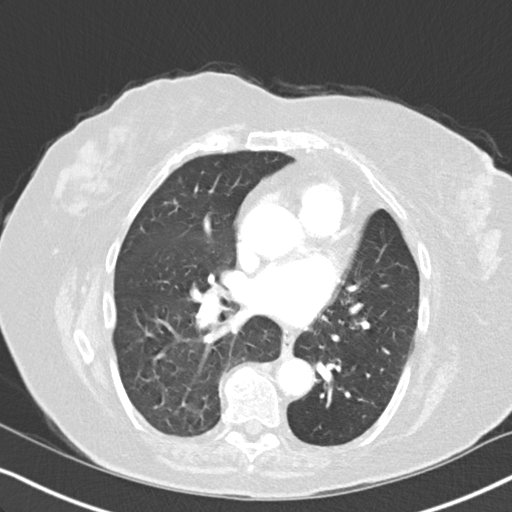
[im 102/166  lung]
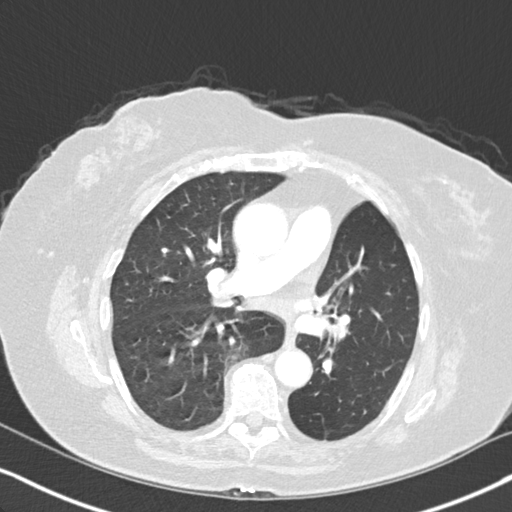
[im 115/166  mediastinal]
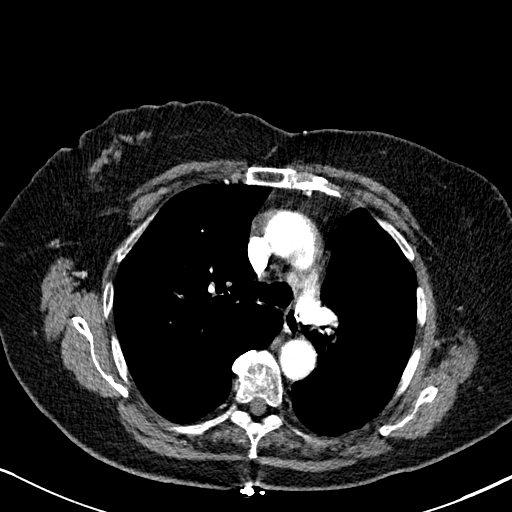
[im 115/166  lung]
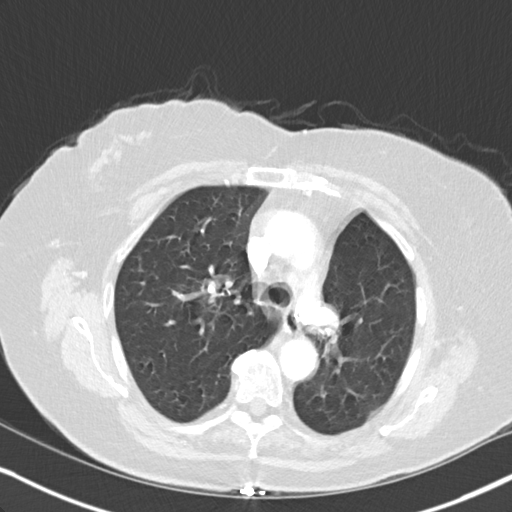
[im 127/166  lung]
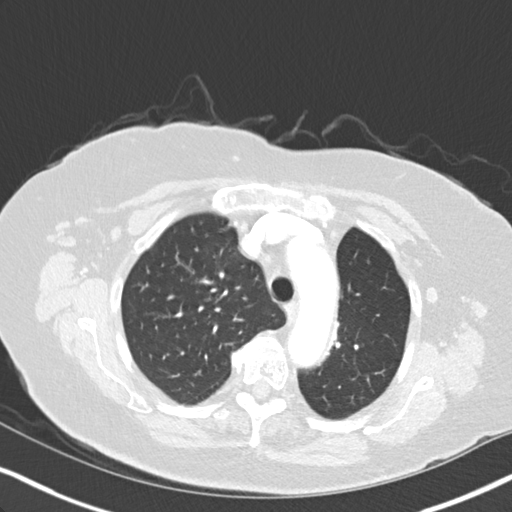
[im 140/166  lung]
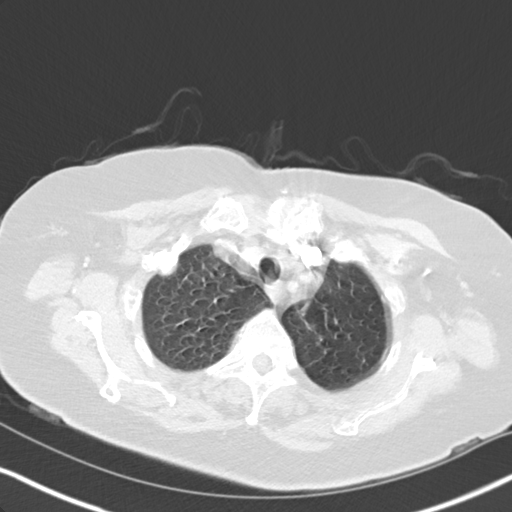
[im 153/166  lung]
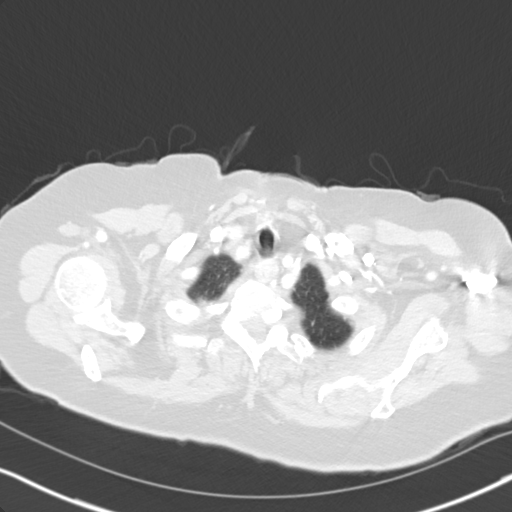

[Series 5: coronal · coronal · 0.66mm/px · 3 of 134 slices shown]
[im 27/134  lung]
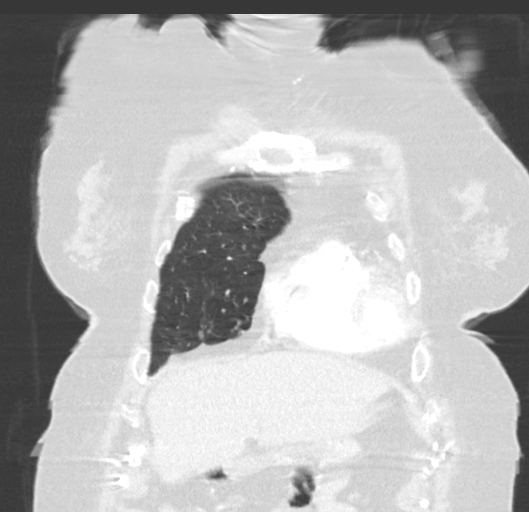
[im 54/134  lung]
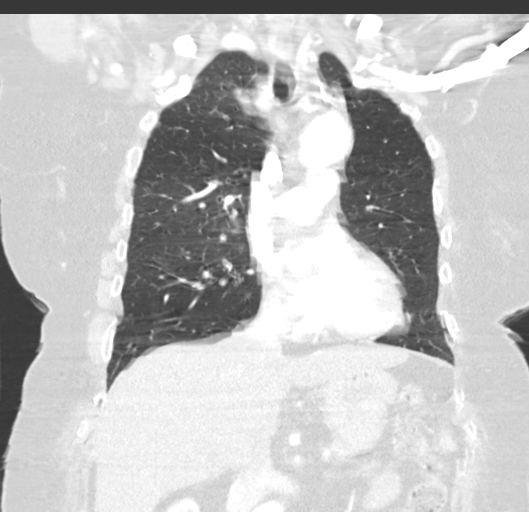
[im 80/134  lung]
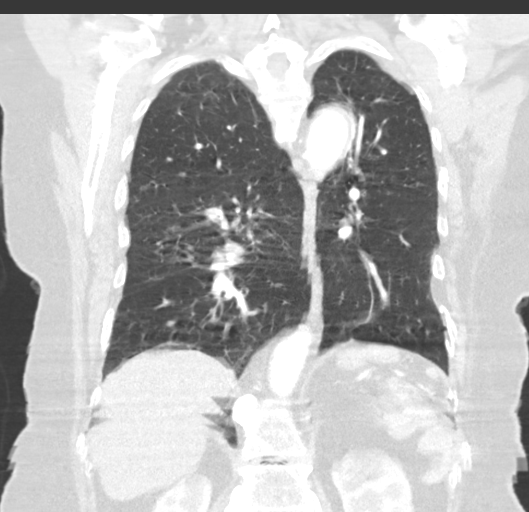

[15 of 36 positions shown; findings below may reference images not displayed]

FINDINGS: Examination is generally limited by breath motion artifact
throughout.

Cardiovascular: Aortic atherosclerosis. Normal heart size. No
pericardial effusion.

Mediastinum/Nodes: No enlarged mediastinal, hilar, or axillary lymph
nodes. Thyroid gland, trachea, and esophagus demonstrate no
significant findings.

Lungs/Pleura: Redemonstrated postoperative findings of left upper
lobectomy. No pleural effusion or pneumothorax.

Upper Abdomen: No acute abnormality.

Musculoskeletal: No chest wall mass or suspicious bone lesions
identified.
IMPRESSION: 1. Examination is generally limited by breath motion artifact
throughout.
2. Redemonstrated postoperative findings of left upper lobectomy. No
evidence of recurrent or metastatic disease in the chest.

Aortic Atherosclerosis ([id number]-930.0).

## 2022-01-05 DIAGNOSIS — Z20822 Contact with and (suspected) exposure to covid-19: Secondary | ICD-10-CM | POA: Diagnosis not present

## 2022-01-10 DIAGNOSIS — Z20822 Contact with and (suspected) exposure to covid-19: Secondary | ICD-10-CM | POA: Diagnosis not present

## 2022-01-30 DIAGNOSIS — Z20822 Contact with and (suspected) exposure to covid-19: Secondary | ICD-10-CM | POA: Diagnosis not present

## 2022-02-02 DIAGNOSIS — Z20822 Contact with and (suspected) exposure to covid-19: Secondary | ICD-10-CM | POA: Diagnosis not present

## 2022-02-05 DIAGNOSIS — Z20828 Contact with and (suspected) exposure to other viral communicable diseases: Secondary | ICD-10-CM | POA: Diagnosis not present

## 2022-02-06 DIAGNOSIS — Z20822 Contact with and (suspected) exposure to covid-19: Secondary | ICD-10-CM | POA: Diagnosis not present

## 2022-02-11 ENCOUNTER — Other Ambulatory Visit: Payer: Self-pay

## 2022-02-11 ENCOUNTER — Emergency Department (HOSPITAL_COMMUNITY)
Admission: EM | Admit: 2022-02-11 | Discharge: 2022-02-11 | Disposition: A | Discharge status: Home / Self Care | Payer: Medicare Other | Attending: Emergency Medicine | Admitting: Emergency Medicine

## 2022-02-11 ENCOUNTER — Encounter: Payer: Self-pay | Admitting: Emergency Medicine

## 2022-02-11 ENCOUNTER — Emergency Department (HOSPITAL_COMMUNITY): Payer: Medicare Other

## 2022-02-11 ENCOUNTER — Ambulatory Visit
Admission: EM | Admit: 2022-02-11 | Discharge: 2022-02-11 | Disposition: A | Discharge status: Home / Self Care | Payer: Medicare Other

## 2022-02-11 DIAGNOSIS — R7981 Abnormal blood-gas level: Secondary | ICD-10-CM

## 2022-02-11 DIAGNOSIS — R051 Acute cough: Secondary | ICD-10-CM

## 2022-02-11 DIAGNOSIS — Z79899 Other long term (current) drug therapy: Secondary | ICD-10-CM | POA: Diagnosis not present

## 2022-02-11 DIAGNOSIS — Z20822 Contact with and (suspected) exposure to covid-19: Secondary | ICD-10-CM | POA: Insufficient documentation

## 2022-02-11 DIAGNOSIS — J40 Bronchitis, not specified as acute or chronic: Secondary | ICD-10-CM | POA: Diagnosis not present

## 2022-02-11 DIAGNOSIS — R0602 Shortness of breath: Secondary | ICD-10-CM | POA: Insufficient documentation

## 2022-02-11 DIAGNOSIS — R079 Chest pain, unspecified: Secondary | ICD-10-CM | POA: Diagnosis not present

## 2022-02-11 DIAGNOSIS — R059 Cough, unspecified: Secondary | ICD-10-CM | POA: Diagnosis not present

## 2022-02-11 DIAGNOSIS — N3000 Acute cystitis without hematuria: Secondary | ICD-10-CM | POA: Diagnosis not present

## 2022-02-11 LAB — COMPREHENSIVE METABOLIC PANEL
ALT: 18 U/L (ref 0–44)
AST: 16 U/L (ref 15–41)
Albumin: 3.7 g/dL (ref 3.5–5.0)
Alkaline Phosphatase: 70 U/L (ref 38–126)
Anion gap: 8 (ref 5–15)
BUN: 14 mg/dL (ref 8–23)
CO2: 26 mmol/L (ref 22–32)
Calcium: 9.6 mg/dL (ref 8.9–10.3)
Chloride: 105 mmol/L (ref 98–111)
Creatinine, Ser: 0.61 mg/dL (ref 0.44–1.00)
GFR, Estimated: >60 mL/min (ref >60)
Glucose, Bld: 84 mg/dL (ref 70–99)
Potassium: 4.1 mmol/L (ref 3.5–5.1)
Sodium: 139 mmol/L (ref 135–145)
Total Bilirubin: 0.7 mg/dL (ref 0.3–1.2)
Total Protein: 7.4 g/dL (ref 6.5–8.1)

## 2022-02-11 LAB — URINALYSIS, ROUTINE W REFLEX MICROSCOPIC
Bilirubin Urine: NEGATIVE
Glucose, UA: NEGATIVE mg/dL
Hgb urine dipstick: NEGATIVE
Ketones, ur: 5 mg/dL — AB
Nitrite: NEGATIVE
Protein, ur: NEGATIVE mg/dL
Specific Gravity, Urine: 1.024 (ref 1.005–1.030)
WBC, UA: >50 WBC/hpf — ABNORMAL HIGH (ref 0–5)
pH: 5 (ref 5.0–8.0)

## 2022-02-11 LAB — CBC
HCT: 43.9 % (ref 36.0–46.0)
Hemoglobin: 14.4 g/dL (ref 12.0–15.0)
MCH: 30.6 pg (ref 26.0–34.0)
MCHC: 32.8 g/dL (ref 30.0–36.0)
MCV: 93.4 fL (ref 80.0–100.0)
Platelets: 319 10*3/uL (ref 150–400)
RBC: 4.7 MIL/uL (ref 3.87–5.11)
RDW: 13.1 % (ref 11.5–15.5)
WBC: 11 10*3/uL — ABNORMAL HIGH (ref 4.0–10.5)
nRBC: 0 % (ref 0.0–0.2)

## 2022-02-11 LAB — RESP PANEL BY RT-PCR (FLU A&B, COVID) ARPGX2
Influenza A by PCR: NEGATIVE
Influenza B by PCR: NEGATIVE
SARS Coronavirus 2 by RT PCR: NEGATIVE

## 2022-02-11 LAB — BRAIN NATRIURETIC PEPTIDE: B Natriuretic Peptide: 29.5 pg/mL (ref 0.0–100.0)

## 2022-02-11 MED ORDER — SULFAMETHOXAZOLE-TRIMETHOPRIM 800-160 MG PO TABS: 1.0000 | ORAL_TABLET | Freq: Two times a day (BID) | ORAL | 0 refills | Status: AC

## 2022-02-11 MED ORDER — DEXAMETHASONE SODIUM PHOSPHATE 10 MG/ML IJ SOLN: 6.0000 mg | Freq: Once | INTRAMUSCULAR | Status: DC

## 2022-02-11 MED ORDER — DEXAMETHASONE SODIUM PHOSPHATE 10 MG/ML IJ SOLN
6.0000 mg | Freq: Once | INTRAMUSCULAR | Status: AC
  Administered 2022-02-11: 6 mg via INTRAMUSCULAR
  Filled 2022-02-11: qty 1

## 2022-02-11 MED ORDER — DOXYCYCLINE HYCLATE 100 MG PO TABS: 100.0000 mg | ORAL_TABLET | Freq: Once | ORAL | Status: DC

## 2022-02-11 MED ORDER — CEPHALEXIN 500 MG PO CAPS: 500.0000 mg | ORAL_CAPSULE | Freq: Once | ORAL | Status: DC

## 2022-02-11 MED ORDER — SULFAMETHOXAZOLE-TRIMETHOPRIM 800-160 MG PO TABS
1.0000 | ORAL_TABLET | Freq: Once | ORAL | Status: AC
  Administered 2022-02-11: 1 via ORAL
  Filled 2022-02-11: qty 1

## 2022-02-11 MED ORDER — ALBUTEROL SULFATE (2.5 MG/3ML) 0.083% IN NEBU
5.0000 mg | INHALATION_SOLUTION | Freq: Once | RESPIRATORY_TRACT | Status: AC
  Administered 2022-02-11: 5 mg via RESPIRATORY_TRACT
  Filled 2022-02-11: qty 6

## 2022-02-11 MED ORDER — ALBUTEROL SULFATE HFA 108 (90 BASE) MCG/ACT IN AERS
2.0000 | INHALATION_SPRAY | Freq: Four times a day (QID) | RESPIRATORY_TRACT | Status: DC
  Administered 2022-02-11: 2 via RESPIRATORY_TRACT
  Filled 2022-02-11: qty 6.7

## 2022-02-11 NOTE — ED Notes (Signed)
Pt O2 sats between 93-100% while ambulating down the hall. Pt stated she did not feel short of breath, but stated "my body feels shaky". ?

## 2022-02-11 NOTE — ED Triage Notes (Signed)
Patient c/o productive "wet" cough x 1 week, worse today after vomiting her breakfast.  Patient denies any OTC cold/cough meds. ?

## 2022-02-11 NOTE — ED Provider Notes (Signed)
?[facility] ? ? ? ?CSN: [id number] ?Arrival date & time: 02/11/22  1324 ? ? ?  ? ?History   ?Chief Complaint ?Chief Complaint  ?Patient presents with  ? Cough  ? ? ?HPI ?Mallory Boone is a 76 y.o. female.  ? ?Patient presents with productive cough that has been present for approximately 1.5 weeks.  Patient denies any associated upper respiratory symptoms, fever, chest pain, shortness of breath, sore throat, ear pain, nausea, vomiting, diarrhea, abdominal pain.  Patient and significant other denies history of asthma or COPD but does report that she has had a left lower lobectomy due to history of carcinoma of the lung.  She has not taken any over-the-counter medications to help alleviate symptoms. ? ? ?Cough ? ?Past Medical History:  ?Diagnosis Date  ? Depression   ? Encephalitis   ? Osteoporosis   ? Seizures ([place])   ? ? ?Patient Active Problem List  ? Diagnosis Date Noted  ? 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 Localization-related (focal) (partial) symptomatic epilepsy and epileptic syndromes with complex partial seizures, intractable, without status epilepticus ([place]) 07/07/2018  ? Encephalopathy acute 07/01/2018  ? Encephalopathy 06/30/2018  ? 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 Seizure ([place]) 06/30/2018  ? Encephalomyopathy 06/30/2018  ? ? ?Past Surgical History:  ?Procedure Laterality Date  ? ABDOMINAL HYSTERECTOMY    ? left lobe lobectomy    ? SPLENECTOMY, TOTAL    ? ? ?OB History   ?No obstetric history on file. ?  ? ? ? ?Home Medications   ? ?Prior to Admission medications   ?Medication Sig Start Date End Date Taking? Authorizing Provider  ?alendronate (FOSAMAX) 70 MG tablet Take 70 mg by mouth once a week. Take with a full glass of water on an empty stomach.   Yes [provider]  ?calcium-vitamin D (OSCAL WITH D) 500-200 MG-UNIT tablet Take 1 tablet by mouth.   Yes [provider]  ?lacosamide (VIMPAT) 200 MG TABS tablet Take 1 tablet (200 mg total) by mouth 2 (two) times  daily. 10/10/21  Yes Cameron Sprang, MD  ?levETIRAcetam (KEPPRA XR) 500 MG 24 hr tablet Take 4 tablets every night 10/10/21  Yes Cameron Sprang, MD  ?LORazepam (ATIVAN) 1 MG tablet Take 1 tablet as needed for cluster of seizures. 10/10/21  Yes Cameron Sprang, MD  ?Multiple Vitamins-Minerals (MULTIVITAMIN WITH MINERALS) tablet Take 1 tablet by mouth daily.   Yes [provider]  ?Vilazodone HCl 20 MG TABS Take 20 mg by mouth daily.   Yes [provider]  ? ? ?Family History ?History reviewed. No pertinent family history. ? ?Social History ?Social History  ? ?Tobacco Use  ? Smoking status: Never  ? Smokeless tobacco: Never  ?Vaping Use  ? Vaping Use: Never used  ?Substance Use Topics  ? Alcohol use: Never  ? Drug use: Never  ? ? ? ?Allergies   ?Ampicillin ? ? ?Review of Systems ?Review of Systems ?Per HPI ? ?Physical Exam ?Triage Vital Signs ?ED Triage Vitals  ?Enc Vitals Group  ?   BP 02/11/22 1342 120/69  ?   Pulse Rate 02/11/22 1342 79  ?   Resp 02/11/22 1342 18  ?   Temp 02/11/22 1342 98.2 ?F (36.8 ?C)  ?   Temp Source 02/11/22 1342 Oral  ?   SpO2 02/11/22 1342 91 %  ?   Weight --   ?   Height 02/11/22 1343 5\' 6"  (1.676 m)  ?   Head Circumference --   ?  Peak Flow --   ?   Pain Score 02/11/22 1343 0  ?   Pain Loc --   ?   Pain Edu? --   ?   Excl. in [place]? --   ? ?No data found. ? ?Updated Vital Signs ?BP 120/69 (BP Location: Left Arm)   Pulse 79   Temp 98.2 ?F (36.8 ?C) (Oral)   Resp 18   Ht 5\' 6"  (1.676 m)   SpO2 91%   BMI 30.96 kg/m?  ? ?Visual Acuity ?Right Eye Distance:   ?Left Eye Distance:   ?Bilateral Distance:   ? ?Right Eye Near:   ?Left Eye Near:    ?Bilateral Near:    ? ?Physical Exam ?Constitutional:   ?   Appearance: Normal appearance. She is not toxic-appearing or diaphoretic.  ?HENT:  ?   Head: Normocephalic and atraumatic.  ?   Right Ear: Tympanic membrane and ear canal normal.  ?   Left Ear: Tympanic membrane and ear canal normal.  ?   Nose: Congestion present.  ?    Mouth/Throat:  ?   Mouth: Mucous membranes are moist.  ?   Pharynx: No posterior oropharyngeal erythema.  ?Eyes:  ?   Extraocular Movements: Extraocular movements intact.  ?   Conjunctiva/sclera: Conjunctivae normal.  ?   Pupils: Pupils are equal, round, and reactive to light.  ?Cardiovascular:  ?   Rate and Rhythm: Normal rate and regular rhythm.  ?   Pulses: Normal pulses.  ?   Heart sounds: Normal heart sounds.  ?Pulmonary:  ?   Effort: Pulmonary effort is normal. No respiratory distress.  ?   Breath sounds: No stridor. Wheezing and rhonchi present. No rales.  ?Abdominal:  ?   General: Abdomen is flat. Bowel sounds are normal.  ?   Palpations: Abdomen is soft.  ?Musculoskeletal:     ?   General: Normal range of motion.  ?   Cervical back: Normal range of motion.  ?Skin: ?   General: Skin is warm and dry.  ?Neurological:  ?   General: No focal deficit present.  ?   Mental Status: She is alert and oriented to person, place, and time. Mental status is at baseline.  ?Psychiatric:     ?   Mood and Affect: Mood normal.     ?   Behavior: Behavior normal.  ? ? ? ?UC Treatments / Results  ?Labs ?(all labs ordered are listed, but only abnormal results are displayed) ?Labs Reviewed - No data to display ? ?EKG ? ? ?Radiology ?No results found. ? ?Procedures ?Procedures (including critical care time) ? ?Medications Ordered in UC ?Medications - No data to display ? ?Initial Impression / Assessment and Plan / UC Course  ?I have reviewed the triage vital signs and the nursing notes. ? ?Pertinent labs & imaging results that were available during my care of the patient were reviewed by me and considered in my medical decision making (see chart for details). ? ?  ? ?Called to room during nursing triage given the oxygen was low.  Oxygen is ranging from 88 to 90% during initial triage and physical exam.  Patient does not appear to be in any distress and denies shortness of breath.  Wheezing and rhonchi noted on exam to bilateral lung  fields.  Due to low oxygen saturation, I do think this warrants further evaluation and management at the hospital.  Patient and significant other were agreeable with plan.  Suggested EMS transport but patient  and significant other declined.  Risks associated with not going by EMS were discussed with them.  They voiced understanding.  Due to patient declining going by EMS and to ensure they arrive at ER in a timely manner due to LOW oxygen saturation, will defer albuterol treatment at this time to avoid any adverse effects and due to not being able to monitor post discharge.  Patient left via her husband transporting her to the hospital. ?Final Clinical Impressions(s) / UC Diagnoses  ? ?Final diagnoses:  ?Low oxygen saturation  ?Acute cough  ? ? ? ?Discharge Instructions   ? ?  ?Go to the emergency department as soon as you leave urgent care for further evaluation and management. ? ? ? ?ED Prescriptions   ?None ?  ? ?PDMP not reviewed this encounter. ?  ?Teodora Medici, [place] ?02/11/22 1404 ? ?

## 2022-02-11 NOTE — ED Provider Notes (Signed)
?[facility] DEPT ?Provider Note ? ? ?CSN: [id number] ?Arrival date & time: 02/11/22  1426 ? ?  ? ?History ? ?Chief Complaint  ?Patient presents with  ? Cough  ? Emesis  ? ? ?Mallory Boone is a 76 y.o. female. ? ?Patient is a 76 year old female who presents with cough and weakness.  She has had a dry cough with some postnasal drip for about a week.  Over the last 2 days she has had more of a wet cough and is felt worse.  She has felt fatigued and has myalgias.  No known fevers.  She has had some decreased appetite..  No associated chest pain.  She does report some shortness of breath at times.  She has had some posttussive emesis.  She went to urgent care and her oxygen levels were noted to be 89 to 90% on room air.  She was sent here for further evaluation. ? ? ?  ? ?Home Medications ?Prior to Admission medications   ?Medication Sig Start Date End Date Taking? Authorizing Provider  ?sulfamethoxazole-trimethoprim (BACTRIM DS) 800-160 MG tablet Take 1 tablet by mouth 2 (two) times daily for 7 days. 02/11/22 02/18/22 Yes Malvin Johns, MD  ?alendronate (FOSAMAX) 70 MG tablet Take 70 mg by mouth once a week. Take with a full glass of water on an empty stomach.    [provider]  ?calcium-vitamin D (OSCAL WITH D) 500-200 MG-UNIT tablet Take 1 tablet by mouth.    [provider]  ?lacosamide (VIMPAT) 200 MG TABS tablet Take 1 tablet (200 mg total) by mouth 2 (two) times daily. 10/10/21   Cameron Sprang, MD  ?levETIRAcetam (KEPPRA XR) 500 MG 24 hr tablet Take 4 tablets every night 10/10/21   Cameron Sprang, MD  ?LORazepam (ATIVAN) 1 MG tablet Take 1 tablet as needed for cluster of seizures. 10/10/21   Cameron Sprang, MD  ?Multiple Vitamins-Minerals (MULTIVITAMIN WITH MINERALS) tablet Take 1 tablet by mouth daily.    [provider]  ?Vilazodone HCl 20 MG TABS Take 20 mg by mouth daily.    [provider]  ?   ? ?Allergies    ?Ampicillin   ? ?Review of Systems    ?Review of Systems  ?Constitutional:  Positive for appetite change and fatigue. Negative for chills, diaphoresis and fever.  ?HENT:  Positive for congestion and rhinorrhea. Negative for sneezing.   ?Eyes: Negative.   ?Respiratory:  Positive for cough and shortness of breath. Negative for chest tightness.   ?Cardiovascular:  Negative for chest pain and leg swelling.  ?Gastrointestinal:  Positive for vomiting. Negative for abdominal pain, blood in stool, diarrhea and nausea.  ?Genitourinary:  Negative for difficulty urinating, flank pain, frequency and hematuria.  ?Musculoskeletal:  Negative for arthralgias and back pain.  ?Skin:  Negative for rash.  ?Neurological:  Negative for dizziness, speech difficulty, weakness, numbness and headaches.  ? ?Physical Exam ?Updated Vital Signs ?BP (!) 148/71   Pulse 68   Temp 98.1 ?F (36.7 ?C) (Oral)   Resp (!) 22   Ht 5\' 6"  (1.676 m)   Wt 87 kg   SpO2 95%   BMI 30.96 kg/m?  ?Physical Exam ?Constitutional:   ?   Appearance: She is well-developed.  ?HENT:  ?   Head: Normocephalic and atraumatic.  ?Eyes:  ?   Pupils: Pupils are equal, round, and reactive to light.  ?Cardiovascular:  ?   Rate and Rhythm: Normal rate and regular rhythm.  ?  Heart sounds: Normal heart sounds.  ?Pulmonary:  ?   Effort: Pulmonary effort is normal. No respiratory distress.  ?   Breath sounds: Wheezing present. No rales.  ?   Comments: Decreased breath sounds with some wheezes and rhonchi ?Chest:  ?   Chest wall: No tenderness.  ?Abdominal:  ?   General: Bowel sounds are normal.  ?   Palpations: Abdomen is soft.  ?   Tenderness: There is no abdominal tenderness. There is no guarding or rebound.  ?Musculoskeletal:     ?   General: Normal range of motion.  ?   Cervical back: Normal range of motion and neck supple.  ?   Right lower leg: No edema.  ?   Left lower leg: No edema.  ?Lymphadenopathy:  ?   Cervical: No cervical adenopathy.  ?Skin: ?   General: Skin is warm and dry.  ?   Findings: No rash.   ?Neurological:  ?   Mental Status: She is alert and oriented to person, place, and time.  ? ? ?ED Results / Procedures / Treatments   ?Labs ?(all labs ordered are listed, but only abnormal results are displayed) ?Labs Reviewed  ?CBC - Abnormal; Notable for the following components:  ?    Result Value  ? WBC 11.0 (*)   ? All other components within normal limits  ?URINALYSIS, ROUTINE W REFLEX MICROSCOPIC - Abnormal; Notable for the following components:  ? Color, Urine AMBER (*)   ? APPearance CLOUDY (*)   ? Ketones, ur 5 (*)   ? Leukocytes,Ua LARGE (*)   ? WBC, UA >50 (*)   ? Bacteria, UA RARE (*)   ? All other components within normal limits  ?RESP PANEL BY RT-PCR (FLU A&B, COVID) ARPGX2  ?COMPREHENSIVE METABOLIC PANEL  ?BRAIN NATRIURETIC PEPTIDE  ? ? ?EKG ?EKG Interpretation ? 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 ?Radiology ?DG Chest 2 View ? ?Result Date: 02/11/2022 ?CLINICAL DATA:  Productive cough and chest pain over the last 10 days. EXAM: CHEST - 2 VIEW COMPARISON:  07/02/2018 FINDINGS: Heart size is normal. Chronic aortic atherosclerosis. Surgical clips in the left hilum, presumed related to previous lobectomy. Chronic pulmonary scarring. No evidence of pneumonia, collapse or effusion. 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 No sign of pneumonia, collapse or effusion. Electronically Signed   By: Nelson Chimes M.D.   On: 02/11/2022 15:18   ? ?Procedures ?Procedures  ? ? ?Medications Ordered in ED ?Medications  ?sulfamethoxazole-trimethoprim (BACTRIM DS) 800-160 MG per tablet 1 tablet (has no administration in time range)  ?dexamethasone (DECADRON) injection 6 mg (has no administration in time  range)  ?albuterol (VENTOLIN HFA) 108 (90 Base) MCG/ACT inhaler 2 puff (has no administration in time range)  ?albuterol (PROVENTIL) (2.5 MG/3ML) 0.083% nebulizer solution 5 mg (5 mg Nebulization Given 02/11/22 2004)  ? ? ?ED Course/ Medical Decision Making/ A&P ?  ?                        ?Medical Decision Making ?Amount and/or Complexity of Data Reviewed ?Labs: ordered. ?Radiology: ordered. ? ?Risk ?Prescription drug management. ? ? ?Patient is a 76 year old female who presents with  1 week history of a dry cough and recent worsening over the last couple days.  She has some wheezing on exam.  There is no hypoxia on our exam or evaluation in the ED.  There was some reported hypoxia at urgent care.  She is not tachypneic or obviously short of breath in the ED.  Chest x-ray was performed and interpreted by me to show no acute abnormalities.  No evidence of pneumonia.  No pulmonary edema.  Her COVID/flu test were negative.  Her other labs are nonconcerning.  Her urinalysis looks concerning for infection.  She was given an albuterol treatment with some moderate improvement in symptoms.  She does not report that she feels any differently but her lung sounds have improved.  She was also given a dose of Decadron.  I feel she likely has a bronchitis with some reactive airway disease.  We did ambulate her here in the ED without any hypoxia.  Her oxygen saturation stayed between 93 to 100%.  She had no associated shortness of breath.  I do not feel at this point that she needs inpatient treatment.  She does not have any concerns for sepsis.  She was discharged home in good condition.  She was given a prescription for Bactrim for her urinary tract infection.  On chart review, it appears that she had an allergy to ampicillin although she developed a rash after she was given ampicillin, vancomycin, Rocephin and acyclovir.  It is unclear what exactly caused the rash.  I consulted with the pharmacist and she could not find that the  patient had had any cephalosporins since that time.  Given this, will treat with Bactrim.  This should also cover some respiratory pathogens although she does not have any evidence of bacterial infection in her l

## 2022-02-11 NOTE — ED Provider Triage Note (Signed)
Emergency Medicine Provider Triage Evaluation Note ? ?Mallory Boone , a 76 y.o. female  was evaluated in triage.  Pt complains of cough.  Patient seen in urgent care, sent here for hypoxia.  Patient denies all symptoms.  Patient endorsing a cough, wet cough, emesis after eating.  Patient denies any fevers, chest pain.  Patient reports she has been sleeping horribly, cannot tell me why.  Patient has history of dementia, with husband at bedside. ? ?Review of Systems  ?Positive:  ?Negative:  ? ?Physical Exam  ?BP (!) 145/81 (BP Location: Left Arm)   Pulse 79   Temp 98.1 ?F (36.7 ?C) (Oral)   Resp 18   Ht 5\' 6"  (1.676 m)   Wt 87 kg   SpO2 94%   BMI 30.96 kg/m?  ?Gen:   Awake, no distress   ?Resp:  Normal effort  ?MSK:   Moves extremities without difficulty  ?Other:  Coarse lung sounds, wheezing ? ?Medical Decision Making  ?Medically screening exam initiated at 3:05 PM.  Appropriate orders placed.  Mallory Boone was informed that the remainder of the evaluation will be completed by another provider, this initial triage assessment does not replace that evaluation, and the importance of remaining in the ED until their evaluation is complete. ? ? ?  ?Azucena Cecil, PA-C ?02/11/22 1507 ? ?

## 2022-02-11 NOTE — ED Triage Notes (Addendum)
BIB husband, pt was sent from UC for hypoxia. Husband reports productive coughing x1.5 weeks but got much worse last night, the coughing made her vomit this AM. Pt reports maxillary sinus tenderness, started today. Denies fevers or recent sick contacts.  ?

## 2022-02-11 NOTE — ED Notes (Signed)
Patient is being discharged from the Urgent Care and sent to the Emergency Department via private vehicle . Per [place] patient is in need of higher level of care due to further evaluation. Patient is aware and verbalizes understanding of plan of care.  ?Vitals:  ? 02/11/22 1342  ?BP: 120/69  ?Pulse: 79  ?Resp: 18  ?Temp: 98.2 ?F (36.8 ?C)  ?SpO2: 91%  ?  ?

## 2022-02-11 NOTE — Discharge Instructions (Addendum)
Take the antibiotics as prescribed.  Use the albuterol inhaler 2 puffs every 4-6 hours for the next few days.  Follow-up with your doctor in the next few days for recheck.  Return to the emergency room if you have any worsening symptoms. ?

## 2022-02-11 NOTE — Discharge Instructions (Signed)
Go to the emergency department as soon as you leave urgent care for further evaluation and management. 

## 2022-02-11 NOTE — ED Notes (Signed)
I provided reinforced discharge education based off of discharge instructions. Pt acknowledged and understood my education. Pt had no further questions/concerns for provider/myself.  °

## 2022-02-21 ENCOUNTER — Ambulatory Visit
Admission: RE | Admit: 2022-02-21 | Discharge: 2022-02-21 | Disposition: A | Discharge status: Home / Self Care | Payer: Medicare Other | Source: Ambulatory Visit | Attending: Internal Medicine | Admitting: Internal Medicine

## 2022-02-21 DIAGNOSIS — R922 Inconclusive mammogram: Secondary | ICD-10-CM | POA: Diagnosis not present

## 2022-02-21 DIAGNOSIS — N6310 Unspecified lump in the right breast, unspecified quadrant: Secondary | ICD-10-CM

## 2022-02-21 DIAGNOSIS — N6311 Unspecified lump in the right breast, upper outer quadrant: Secondary | ICD-10-CM | POA: Diagnosis not present

## 2022-04-17 DIAGNOSIS — R413 Other amnesia: Secondary | ICD-10-CM | POA: Diagnosis not present

## 2022-04-17 DIAGNOSIS — G0490 Encephalitis and encephalomyelitis, unspecified: Secondary | ICD-10-CM | POA: Diagnosis not present

## 2022-04-17 DIAGNOSIS — G40219 Localization-related (focal) (partial) symptomatic epilepsy and epileptic syndromes with complex partial seizures, intractable, without status epilepticus: Secondary | ICD-10-CM | POA: Diagnosis not present

## 2022-04-17 DIAGNOSIS — R32 Unspecified urinary incontinence: Secondary | ICD-10-CM | POA: Diagnosis not present

## 2022-05-03 DIAGNOSIS — N3942 Incontinence without sensory awareness: Secondary | ICD-10-CM | POA: Diagnosis not present

## 2022-05-07 DIAGNOSIS — R32 Unspecified urinary incontinence: Secondary | ICD-10-CM | POA: Diagnosis not present

## 2022-05-07 DIAGNOSIS — G40219 Localization-related (focal) (partial) symptomatic epilepsy and epileptic syndromes with complex partial seizures, intractable, without status epilepticus: Secondary | ICD-10-CM | POA: Diagnosis not present

## 2022-05-07 DIAGNOSIS — I69998 Other sequelae following unspecified cerebrovascular disease: Secondary | ICD-10-CM | POA: Diagnosis not present

## 2022-05-07 DIAGNOSIS — E6609 Other obesity due to excess calories: Secondary | ICD-10-CM | POA: Diagnosis not present

## 2022-05-07 DIAGNOSIS — I700 Atherosclerosis of aorta: Secondary | ICD-10-CM | POA: Diagnosis not present

## 2022-05-07 DIAGNOSIS — Z85118 Personal history of other malignant neoplasm of bronchus and lung: Secondary | ICD-10-CM | POA: Diagnosis not present

## 2022-05-07 DIAGNOSIS — G0490 Encephalitis and encephalomyelitis, unspecified: Secondary | ICD-10-CM | POA: Diagnosis not present

## 2022-05-07 DIAGNOSIS — F3341 Major depressive disorder, recurrent, in partial remission: Secondary | ICD-10-CM | POA: Diagnosis not present

## 2022-05-07 DIAGNOSIS — Z9081 Acquired absence of spleen: Secondary | ICD-10-CM | POA: Diagnosis not present

## 2022-05-07 DIAGNOSIS — R413 Other amnesia: Secondary | ICD-10-CM | POA: Diagnosis not present

## 2022-05-07 DIAGNOSIS — M810 Age-related osteoporosis without current pathological fracture: Secondary | ICD-10-CM | POA: Diagnosis not present

## 2022-05-07 DIAGNOSIS — R251 Tremor, unspecified: Secondary | ICD-10-CM | POA: Diagnosis not present

## 2022-05-15 ENCOUNTER — Ambulatory Visit (INDEPENDENT_AMBULATORY_CARE_PROVIDER_SITE_OTHER): Payer: Medicare Other | Admitting: Neurology

## 2022-05-15 ENCOUNTER — Encounter: Payer: Self-pay | Admitting: Neurology

## 2022-05-15 VITALS — BP 123/71 | HR 71 | Ht 66.0 in | Wt 192.0 lb

## 2022-05-15 DIAGNOSIS — R251 Tremor, unspecified: Secondary | ICD-10-CM

## 2022-05-15 DIAGNOSIS — G40219 Localization-related (focal) (partial) symptomatic epilepsy and epileptic syndromes with complex partial seizures, intractable, without status epilepticus: Secondary | ICD-10-CM | POA: Diagnosis not present

## 2022-05-15 DIAGNOSIS — R4789 Other speech disturbances: Secondary | ICD-10-CM | POA: Diagnosis not present

## 2022-05-15 MED ORDER — LACOSAMIDE 200 MG PO TABS: 200.0000 mg | ORAL_TABLET | Freq: Two times a day (BID) | ORAL | 3 refills | Status: DC

## 2022-05-15 MED ORDER — LEVETIRACETAM ER 500 MG PO TB24: ORAL_TABLET | ORAL | 3 refills | Status: DC

## 2022-05-15 NOTE — Patient Instructions (Signed)
Always good to see you.  Schedule MRI brain with and without contrast  2. Continue Levetiracetam (Keppra) ER 500mg : take 4 tablets every night  3. Continue Lacosamide (Vimpat) 200mg  twice a day  4. Continue symptom calendar  5. Follow-up in 6 months, call for any changes   Seizure Precautions: 1. If medication has been prescribed for you to prevent seizures, take it exactly as directed.  Do not stop taking the medicine without talking to your doctor first, even if you have not had a seizure in a long time.   2. Avoid activities in which a seizure would cause danger to yourself or to others.  Don't operate dangerous machinery, swim alone, or climb in high or dangerous places, such as on ladders, roofs, or girders.  Do not drive unless your doctor says you may.  3. If you have any warning that you may have a seizure, lay down in a safe place where you can't hurt yourself.    4.  No driving for 6 months from last seizure, as per [facility].   Please refer to the following link on the [place] website for more information: http://www.epilepsyfoundation.org/answerplace/Social/driving/drivingu.cfm   5.  Maintain good sleep hygiene.  6.  Contact your doctor if you have any problems that may be related to the medicine you are taking.  7.  Call 911 and bring the patient back to the ED if:        A.  The seizure lasts longer than 5 minutes.       B.  The patient doesn't awaken shortly after the seizure  C.  The patient has new problems such as difficulty seeing, speaking or moving  D.  The patient was injured during the seizure  E.  The patient has a temperature over 102 F (39C)  F.  The patient vomited and now is having trouble breathing

## 2022-05-15 NOTE — Progress Notes (Unsigned)
NEUROLOGY FOLLOW UP OFFICE NOTE  Mallory Boone [id number] 18-Dec-1945  HISTORY OF PRESENT ILLNESS: I had the pleasure of seeing Mallory Boone in follow-up in the neurology clinic on 05/15/2022.  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 She is again accompanied by her husband who helps supplement the history today.  Records and images were personally reviewed where available.  Since her last visit, her husband reports 3 milder seizures where she would be briefly confused, no stereotyped movements/perseverating similar to her prior seizures. One time in March, she woke up needing to use the bathroom but was not sure where the bathroom was. She came out back to baseline. In April, she was going to sleep and knew where the bathroom was, she just said "I just need to go." The last occurred early July, she took a shower that night and when she came to the living room, speech was garbled for a few minutes. Over the past month or so, every couple of days she would be surprised that she had urinary incontinence. She is now on Vibegron with no further issues. Her husband notes that her dysnomia has increased, she is having more word-finding difficulties, she can describe an object or says the wrong name. She manages her own medications without difficulty. There has been no change in hand tremor on left, she states it does not affect activities but her husband reminds her food spills when she is holding a plate. Sleep is good. She notes mood is "pretty general, happy," however she is notably more irritable during today's visit. She denies any headaches, dizziness, vision changes, focal numbness/tingling/weakness, no falls. She is on Lacosamide 200mg  BID and Keppra XR 500mg  4 tabs qhs without side effects. She has not needed prn lorazepam.    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 She is amnestic of her seizures with no prior warning symptoms. Her husband describes stereotyped episodes where she would start rocking and grimacing, perseverating repeatedly saying "I'm okay, it's okay" for 10 seconds. She would be disoriented after, saying "I'm hot, I'm cold, I need to use the bathroom, where is it?" Her husband feels they occur more when she is slowing down or when it is quiet (such as in church). He has not seen any nocturnal seizures. She has only had 2 convulsions when she was initially diagnosed with encephalitis in 1996, none since then. He recalls her trying Dilantin and Lamictal in the past. She has been on Keppra XR 2000mg  daily for at least 10 years, Vimpat 100mg  BID was added on 18 months ago. She was having 2-3 seizures a day, and had a significant reduction with addition of Vimpat. Over the past 6 months, her husband has noticed she would sometimes do a little pedaling of both feet. Her husband reports an average of 2 seizures a week, last seizure was 2 days ago. No associated tongue bite or incontinence. No side effects on medications. She denies any olfactory/gustatory hallucinations, deja vu, rising epigastric sensation, focal numbness/tingling/weakness, myoclonic jerks. She denies any headaches, dizziness, diplopia, dysarthria/dysphagia, neck/back pain, bowel dysfunction. She has urinary frequency. Her hands feels stiff sometimes. She has had cognitive issues since the encephalitis, she does not drive. Her husband manages finances. She has to write things down in a journal daily. She manages her own medications. She is  independent with dressing and bathing. They moved to [place] 3 months ago to be closer to family. She said they moved a year ago. Her mother had Alzheimer's disease, her father had Lewy Body dementia.   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  EEG in 06/2018 showed sharp transients in sleep in the right frontal region with phase reversal at F8.    Epilepsy Risk Factors:  Encephalitis in 1996. Otherwise she had a normal birth and early development.  There is no history of febrile convulsions, significant traumatic brain injury, neurosurgical procedures, or family history of seizures.   Prior AEDs: Dilantin, lamictal   PAST MEDICAL HISTORY: Past Medical History:  Diagnosis Date   Depression    Encephalitis    Osteoporosis    Seizures ([place])     MEDICATIONS: Current Outpatient Medications on File Prior to Visit  Medication Sig Dispense Refill   alendronate (FOSAMAX) 70 MG tablet Take 70 mg by mouth once a week. Take with a full glass of water on an empty stomach.     calcium-vitamin D (OSCAL WITH D) 500-200 MG-UNIT tablet Take 1 tablet by mouth.     lacosamide (VIMPAT) 200 MG TABS tablet Take 1 tablet (200 mg total) by mouth 2 (two) times daily. 180 tablet 3   levETIRAcetam (KEPPRA XR) 500 MG 24 hr tablet Take 4 tablets every night 360 tablet 3   LORazepam (ATIVAN) 1 MG tablet Take 1 tablet as needed for cluster of seizures. 10 tablet 5   Multiple Vitamins-Minerals (MULTIVITAMIN WITH MINERALS) tablet Take 1 tablet by mouth daily.     Vibegron (GEMTESA) 75 MG TABS 1 tablet Orally Once a day     Vilazodone HCl 20 MG TABS Take 20 mg by mouth daily.     No current facility-administered medications on file prior to visit.    ALLERGIES: Allergies  Allergen Reactions   Ampicillin Rash    Red maculopapular rash occurred in context of coadministration of ampicillin, vancomycin, and ceftriaxone    FAMILY HISTORY: Family History  Problem Relation Age of Onset   Breast cancer Neg Hx     SOCIAL HISTORY: Social History   Socioeconomic  History   Marital status: Married    Spouse name: Not on file   Number of children: Not on file   Years of education: College   Highest education level: Not on file  Occupational History   Not on file  Tobacco Use   Smoking status: Never   Smokeless tobacco: Never  Vaping Use   Vaping Use: Never used  Substance and Sexual Activity   Alcohol use: Never   Drug use: Never   Sexual activity: Yes    Birth control/protection: Post-menopausal  Other Topics Concern   Not on file  Social History Narrative   Right handed      Lives with husband      Secretary/administrator edu   Social Determinants of Health   Financial Resource Strain: Not on file  Food Insecurity: Not on file  Transportation Needs: Not on file  Physical Activity: Not on file  Stress: Not on file  Social Connections: Not on file  Intimate Partner Violence: Not on file     PHYSICAL EXAM: Vitals:   05/15/22 1522  BP: 123/71  Pulse:  71  SpO2: 96%   General: No acute distress Head:  Normocephalic/atraumatic Skin/Extremities: No rash, no edema Neurological Exam: alert and oriented to person, states it is July 28, does not know year. States she is in [place], [place]. No dysarthria. Fund of knowledge is reduced.  Recent and remote memory impaired, 0/3 delayed recall. Attention and concentration are normal, 5/5 WORLD backwards. She is able to name "pen," initially unable to name "button," saying "you tie them in a knot," then says "button." Cranial nerves: Pupils equal, round. Extraocular movements intact with no nystagmus. Visual fields full.  No facial asymmetry.  Motor: +cogwheeling on left, muscle strength 5/5 throughout with no pronator drift.   Finger to nose testing intact.  Gait narrow-based and steady with left pill-rolling tremor on ambulation. +postural instability.   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 She had been doing well seizure-free for 3 years until the past few months when she has had brief episodes of confusion, unclear if seizure-related, it may be due to underlying cognitive impairment. Her husband has noticed more word-finding difficulties. We discussed repeating MRI brain with and without contrast. Continue Lacosamide 200mg  BID and Keppra XR 500mg  4 tabs qhs. She has prn lorazepam for seizure rescue but has not needed it. Continue symptom calendar. She does not drive. She is noted to be more irritable today, continue to monitor mood. Follow-up in 6 months, call for any changes.   Thank you for allowing me to participate in her care.  Please do not hesitate to call for any questions or concerns.   Ellouise Newer, M.D.   CC: Dr. Osborne Casco

## 2022-05-31 ENCOUNTER — Ambulatory Visit
Admission: RE | Admit: 2022-05-31 | Discharge: 2022-05-31 | Disposition: A | Discharge status: Home / Self Care | Payer: Medicare Other | Source: Ambulatory Visit | Attending: Neurology | Admitting: Neurology

## 2022-05-31 DIAGNOSIS — G40219 Localization-related (focal) (partial) symptomatic epilepsy and epileptic syndromes with complex partial seizures, intractable, without status epilepticus: Secondary | ICD-10-CM

## 2022-05-31 DIAGNOSIS — G319 Degenerative disease of nervous system, unspecified: Secondary | ICD-10-CM | POA: Diagnosis not present

## 2022-05-31 DIAGNOSIS — G9389 Other specified disorders of brain: Secondary | ICD-10-CM | POA: Diagnosis not present

## 2022-05-31 DIAGNOSIS — R251 Tremor, unspecified: Secondary | ICD-10-CM

## 2022-05-31 DIAGNOSIS — G40901 Epilepsy, unspecified, not intractable, with status epilepticus: Secondary | ICD-10-CM | POA: Diagnosis not present

## 2022-05-31 DIAGNOSIS — R4789 Other speech disturbances: Secondary | ICD-10-CM

## 2022-05-31 MED ORDER — GADOBENATE DIMEGLUMINE 529 MG/ML IV SOLN
18.0000 mL | Freq: Once | INTRAVENOUS | Status: AC | PRN
  Administered 2022-05-31: 18 mL via INTRAVENOUS

## 2022-06-04 DIAGNOSIS — L814 Other melanin hyperpigmentation: Secondary | ICD-10-CM | POA: Diagnosis not present

## 2022-06-04 DIAGNOSIS — L578 Other skin changes due to chronic exposure to nonionizing radiation: Secondary | ICD-10-CM | POA: Diagnosis not present

## 2022-06-04 DIAGNOSIS — D225 Melanocytic nevi of trunk: Secondary | ICD-10-CM | POA: Diagnosis not present

## 2022-06-04 DIAGNOSIS — Q825 Congenital non-neoplastic nevus: Secondary | ICD-10-CM | POA: Diagnosis not present

## 2022-06-04 DIAGNOSIS — L821 Other seborrheic keratosis: Secondary | ICD-10-CM | POA: Diagnosis not present

## 2022-06-22 ENCOUNTER — Encounter: Payer: Self-pay | Admitting: Internal Medicine

## 2022-07-13 ENCOUNTER — Emergency Department (HOSPITAL_COMMUNITY): Payer: Medicare Other

## 2022-07-13 ENCOUNTER — Encounter (HOSPITAL_COMMUNITY): Payer: Self-pay

## 2022-07-13 ENCOUNTER — Other Ambulatory Visit: Payer: Self-pay

## 2022-07-13 ENCOUNTER — Emergency Department (HOSPITAL_COMMUNITY)
Admission: EM | Admit: 2022-07-13 | Discharge: 2022-07-14 | Disposition: A | Discharge status: Home / Self Care | Payer: Medicare Other | Attending: Emergency Medicine | Admitting: Emergency Medicine

## 2022-07-13 DIAGNOSIS — J0100 Acute maxillary sinusitis, unspecified: Secondary | ICD-10-CM | POA: Diagnosis not present

## 2022-07-13 DIAGNOSIS — H9203 Otalgia, bilateral: Secondary | ICD-10-CM | POA: Diagnosis not present

## 2022-07-13 DIAGNOSIS — G319 Degenerative disease of nervous system, unspecified: Secondary | ICD-10-CM | POA: Diagnosis not present

## 2022-07-13 DIAGNOSIS — R42 Dizziness and giddiness: Secondary | ICD-10-CM | POA: Diagnosis not present

## 2022-07-13 DIAGNOSIS — R7989 Other specified abnormal findings of blood chemistry: Secondary | ICD-10-CM | POA: Insufficient documentation

## 2022-07-13 DIAGNOSIS — R410 Disorientation, unspecified: Secondary | ICD-10-CM | POA: Diagnosis not present

## 2022-07-13 DIAGNOSIS — R059 Cough, unspecified: Secondary | ICD-10-CM | POA: Diagnosis present

## 2022-07-13 DIAGNOSIS — G9389 Other specified disorders of brain: Secondary | ICD-10-CM | POA: Insufficient documentation

## 2022-07-13 DIAGNOSIS — U071 COVID-19: Secondary | ICD-10-CM | POA: Diagnosis not present

## 2022-07-13 DIAGNOSIS — R4182 Altered mental status, unspecified: Secondary | ICD-10-CM | POA: Diagnosis not present

## 2022-07-13 HISTORY — DX: COVID-19: U07.1

## 2022-07-13 LAB — CBC WITH DIFFERENTIAL/PLATELET
Abs Immature Granulocytes: 0.03 10*3/uL (ref 0.00–0.07)
Basophils Absolute: 0.1 10*3/uL (ref 0.0–0.1)
Basophils Relative: 1 %
Eosinophils Absolute: 0.3 10*3/uL (ref 0.0–0.5)
Eosinophils Relative: 2 %
HCT: 44.7 % (ref 36.0–46.0)
Hemoglobin: 14.2 g/dL (ref 12.0–15.0)
Immature Granulocytes: 0 %
Lymphocytes Relative: 23 %
Lymphs Abs: 2.6 10*3/uL (ref 0.7–4.0)
MCH: 30.2 pg (ref 26.0–34.0)
MCHC: 31.8 g/dL (ref 30.0–36.0)
MCV: 95.1 fL (ref 80.0–100.0)
Monocytes Absolute: 0.7 10*3/uL (ref 0.1–1.0)
Monocytes Relative: 6 %
Neutro Abs: 8 10*3/uL — ABNORMAL HIGH (ref 1.7–7.7)
Neutrophils Relative %: 68 %
Platelets: 357 10*3/uL (ref 150–400)
RBC: 4.7 MIL/uL (ref 3.87–5.11)
RDW: 13 % (ref 11.5–15.5)
WBC: 11.6 10*3/uL — ABNORMAL HIGH (ref 4.0–10.5)
nRBC: 0 % (ref 0.0–0.2)

## 2022-07-13 LAB — COMPREHENSIVE METABOLIC PANEL
ALT: 14 U/L (ref 0–44)
AST: 18 U/L (ref 15–41)
Albumin: 3 g/dL — ABNORMAL LOW (ref 3.5–5.0)
Alkaline Phosphatase: 62 U/L (ref 38–126)
Anion gap: 6 (ref 5–15)
BUN: 15 mg/dL (ref 8–23)
CO2: 26 mmol/L (ref 22–32)
Calcium: 8.7 mg/dL — ABNORMAL LOW (ref 8.9–10.3)
Chloride: 108 mmol/L (ref 98–111)
Creatinine, Ser: 0.69 mg/dL (ref 0.44–1.00)
GFR, Estimated: >60 mL/min (ref >60)
Glucose, Bld: 108 mg/dL — ABNORMAL HIGH (ref 70–99)
Potassium: 4.1 mmol/L (ref 3.5–5.1)
Sodium: 140 mmol/L (ref 135–145)
Total Bilirubin: 0.4 mg/dL (ref 0.3–1.2)
Total Protein: 6.5 g/dL (ref 6.5–8.1)

## 2022-07-13 LAB — PROTIME-INR
INR: 1.1 (ref 0.8–1.2)
Prothrombin Time: 13.9 seconds (ref 11.4–15.2)

## 2022-07-13 LAB — CBG MONITORING, ED: Glucose-Capillary: 111 mg/dL — ABNORMAL HIGH (ref 70–99)

## 2022-07-13 LAB — LACTIC ACID, PLASMA: Lactic Acid, Venous: 0.9 mmol/L (ref 0.5–1.9)

## 2022-07-13 MED ORDER — MECLIZINE HCL 25 MG PO TABS
25.0000 mg | ORAL_TABLET | Freq: Once | ORAL | Status: AC
  Administered 2022-07-13: 25 mg via ORAL
  Filled 2022-07-13: qty 1

## 2022-07-13 MED ORDER — OXYMETAZOLINE HCL 0.05 % NA SOLN
1.0000 | Freq: Once | NASAL | Status: AC
  Administered 2022-07-13: 1 via NASAL
  Filled 2022-07-13: qty 30

## 2022-07-13 MED ORDER — SODIUM CHLORIDE 0.9 % IV BOLUS
1000.0000 mL | Freq: Once | INTRAVENOUS | Status: AC
  Administered 2022-07-13: 1000 mL via INTRAVENOUS

## 2022-07-13 NOTE — ED Triage Notes (Addendum)
Patient's husband reports that the patient was lst seen normal at 2300 last night. Husband states that the patient woke this AM with more confusion than usual and that her balance was off which is new. Patient has a history of encephalitis and seizures. Patient normally has short term memory loss.  Patient's husband states this is Day 5 of being Covid + and has only had nasal congestion and a cough.

## 2022-07-13 NOTE — ED Provider Triage Note (Signed)
Emergency Medicine Provider Triage Evaluation Note  Mallory Boone , a 76 y.o. female  was evaluated in triage.  Pt complains of AMS and balance dysfunction.  Patient's husband is in the room and states she woke up this morning was not making much sense.  She tried to get up and was very off balance which is abnormal for her. He recently was diagnosed with COVID and this is day 5.  Her symptoms have been mild and she has had a cough and nasal congestion.  No shortness of breath.  Patient is not complaining of any dizziness, numbness, or weakness.  Review of Systems  Positive:  Negative:   Physical Exam  BP (!) 143/88 (BP Location: Left Arm)   Pulse 65   Temp 98.3 F (36.8 C) (Oral)   Resp 14   Ht 5\' 6"  (1.676 m)   Wt 88.9 kg   SpO2 93%   BMI 31.64 kg/m  Gen:   Awake, no distress   Resp:  Normal effort  MSK:   Moves extremities without difficulty  Other:  Balance abnormal with gait  Medical Decision Making  Medically screening exam initiated at 1:22 PM.  Appropriate orders placed.  Cindee Salt was informed that the remainder of the evaluation will be completed by another provider, this initial triage assessment does not replace that evaluation, and the importance of remaining in the ED until their evaluation is complete.     Adolphus Birchwood, PA-C 07/13/22 1323

## 2022-07-13 NOTE — ED Notes (Signed)
Dinner tray given

## 2022-07-13 NOTE — ED Provider Notes (Signed)
[facility] DEPT Provider Note   CSN: [id number] Arrival date & time: 07/13/22  1157     History  Chief Complaint  Patient presents with   Covid +   Altered Mental Status   off balance    Mallory Boone is a 76 y.o. female.  Pt is a 76 yo female with a pmhx significant for encephalitis, seizures, and depression.  Pt was diagnosed with covid 5 days ago.  She is on molnupiravir for the Covid.  Today, she woke up and was dizzy and off balance.  She was LSN last night around 11 pm.  Pt's husband supplements history due to hx of short term memory dysfunction due to encephalitis.  He said she has not been too sick with the covid.  Just nasal congestion and a cough.       Home Medications Prior to Admission medications   Medication Sig Start Date End Date Taking? Authorizing Provider  alendronate (FOSAMAX) 70 MG tablet Take 70 mg by mouth once a week. Take with a full glass of water on an empty stomach.   Yes [provider]  calcium-vitamin D (OSCAL WITH D) 500-200 MG-UNIT tablet Take 1 tablet by mouth.   Yes [provider]  lacosamide (VIMPAT) 200 MG TABS tablet Take 1 tablet (200 mg total) by mouth 2 (two) times daily. 05/15/22  Yes Cameron Sprang, MD  LAGEVRIO 200 MG CAPS capsule Take 4 capsules by mouth 2 (two) times daily. 07/09/22  Yes [provider]  levETIRAcetam (KEPPRA XR) 500 MG 24 hr tablet Take 4 tablets every night 05/15/22  Yes Cameron Sprang, MD  LORazepam (ATIVAN) 1 MG tablet Take 1 tablet as needed for cluster of seizures. 10/10/21  Yes Cameron Sprang, MD  Multiple Vitamins-Minerals (MULTIVITAMIN WITH MINERALS) tablet Take 1 tablet by mouth daily.   Yes [provider]  Vibegron (GEMTESA) 75 MG TABS Take 75 mg by mouth daily. 05/04/22  Yes [provider]  Vilazodone HCl 20 MG TABS Take 20 mg by mouth daily.   Yes [provider]      Allergies    Ampicillin    Review of Systems    Review of Systems  HENT:  Positive for rhinorrhea and sinus pain.   Respiratory:  Positive for cough.   All other systems reviewed and are negative.   Physical Exam Updated Vital Signs BP (!) 144/99   Pulse 67   Temp 97.8 F (36.6 C)   Resp (!) 21   Ht 5\' 6"  (1.676 m)   Wt 88.9 kg   SpO2 95%   BMI 31.64 kg/m  Physical Exam Vitals and nursing note reviewed.  Constitutional:      Appearance: Normal appearance.  HENT:     Head: Normocephalic and atraumatic.     Right Ear: External ear normal. A middle ear effusion is present.     Left Ear: External ear normal. A middle ear effusion is present.     Nose: Nose normal.     Mouth/Throat:     Mouth: Mucous membranes are moist.     Pharynx: Oropharynx is clear.  Eyes:     Extraocular Movements: Extraocular movements intact.     Conjunctiva/sclera: Conjunctivae normal.     Pupils: Pupils are equal, round, and reactive to light.  Cardiovascular:     Rate and Rhythm: Normal rate and regular rhythm.     Pulses: Normal pulses.     Heart  sounds: Normal heart sounds.  Pulmonary:     Effort: Pulmonary effort is normal.     Breath sounds: Normal breath sounds.  Abdominal:     General: Abdomen is flat. Bowel sounds are normal.     Palpations: Abdomen is soft.  Musculoskeletal:        General: Normal range of motion.     Cervical back: Normal range of motion and neck supple.  Skin:    General: Skin is warm.     Capillary Refill: Capillary refill takes less than 2 seconds.  Neurological:     Mental Status: She is alert. Mental status is at baseline.     Comments: Tremor left arm (chronic)  Psychiatric:        Mood and Affect: Mood normal.        Behavior: Behavior normal.     ED Results / Procedures / Treatments   Labs (all labs ordered are listed, but only abnormal results are displayed) Labs Reviewed  CBC WITH DIFFERENTIAL/PLATELET - Abnormal; Notable for the following components:      Result Value   WBC 11.6 (*)     Neutro Abs 8.0 (*)    All other components within normal limits  CBG MONITORING, ED - Abnormal; Notable for the following components:   Glucose-Capillary 111 (*)    All other components within normal limits  LACTIC ACID, PLASMA  PROTIME-INR  URINALYSIS, ROUTINE W REFLEX MICROSCOPIC  AMMONIA  COMPREHENSIVE METABOLIC PANEL    EKG EKG Interpretation  Date/Time:  Friday July 13 2022 14:39:48 EDT Ventricular Rate:  64 PR Interval:  152 QRS Duration: 100 QT Interval:  417 QTC Calculation: 431 R Axis:   -51 Text Interpretation: Sinus rhythm LAD, consider left anterior fascicular block RSR' in V1 or V2, right VCD or RVH No significant change since last tracing Confirmed by Isla Pence [id number]) on 07/13/2022 4:29:27 PM  Radiology CT Head Wo Contrast  Result Date: 07/13/2022 CLINICAL DATA:  Altered mental status. Last seen normal last night. More confusion this morning. Off balance. EXAM: CT HEAD WITHOUT CONTRAST TECHNIQUE: Contiguous axial images were obtained from the base of the skull through the vertex without intravenous contrast. RADIATION DOSE REDUCTION: This exam was performed according to the departmental dose-optimization program which includes automated exposure control, adjustment of the mA and/or kV according to patient size and/or use of iterative reconstruction technique. COMPARISON:  06/30/2018. FINDINGS: Brain: No evidence of acute infarction, hemorrhage, hydrocephalus, extra-axial collection or mass lesion/mass effect. Encephalomalacia throughout most of the left temporal lobe, stable from prior exams, reportedly from remote encephalitis. Vascular: No hyperdense vessel or unexpected calcification. Skull: Normal. Negative for fracture or focal lesion. Sinuses/Orbits: Globes and orbits are unremarkable. Left maxillary sinus mucosal thickening and dependent fluid. Mild ethmoid sinus mucosal thickening, mostly on the left. Other: None. IMPRESSION: 1. No acute intracranial  abnormalities. 2. Sinus disease including dependent fluid in the left maxillary sinus. Electronically Signed   By: Lajean Manes M.D.   On: 07/13/2022 15:09   DG Chest 2 View  Result Date: 07/13/2022 CLINICAL DATA:  Altered mental status EXAM: CHEST - 2 VIEW COMPARISON:  02/11/2022 FINDINGS: Cardiac size is within normal limits. Increase in AP diameter of chest suggests COPD. Surgical clips are seen in mediastinum. There are no signs of pulmonary edema or focal pulmonary consolidation. Deformity in the right first rib has not changed. IMPRESSION: There are no signs of pulmonary edema or focal pulmonary consolidation. Electronically Signed   By: Elmer Picker  M.D.   On: 07/13/2022 14:18    Procedures Procedures    Medications Ordered in ED Medications  sodium chloride 0.9 % bolus 1,000 mL (1,000 mLs Intravenous New Bag/Given 07/13/22 1647)  oxymetazoline (AFRIN) 0.05 % nasal spray 1 spray (1 spray Each Nare Provided for home use 07/13/22 1708)  meclizine (ANTIVERT) tablet 25 mg (25 mg Oral Given 07/13/22 1647)    ED Course/ Medical Decision Making/ A&P                           Medical Decision Making Amount and/or Complexity of Data Reviewed Radiology: ordered.  Risk OTC drugs.   This patient presents to the ED for concern of dizziness, this involves an extensive number of treatment options, and is a complaint that carries with it a high risk of complications and morbidity.  The differential diagnosis includes inner ear, cva   Co morbidities that complicate the patient evaluation  encephalitis, seizures, and depression   Additional history obtained:  Additional history obtained from epic chart review External records from outside source obtained and reviewed including husband   Lab Tests:  I Ordered, and personally interpreted labs.  The pertinent results include:  cbc nl, lactic 0.9; inr 1.1   Imaging Studies ordered:  I ordered imaging studies including cxr, ct  head, mri  I independently visualized and interpreted imaging which showed  CXR: IMPRESSION:  There are no signs of pulmonary edema or focal pulmonary  consolidation.  CT head: IMPRESSION:  1. No acute intracranial abnormalities.  2. Sinus disease including dependent fluid in the left maxillary  sinus.   I agree with the radiologist interpretation   Cardiac Monitoring:  The patient was maintained on a cardiac monitor.  I personally viewed and interpreted the cardiac monitored which showed an underlying rhythm of: nsr   Medicines ordered and prescription drug management:  I ordered medication including afrin, antivert, ivfs  for dizziness  Reevaluation of the patient after these medicines showed that the patient improved I have reviewed the patients home medicines and have made adjustments as needed   Test Considered:  mri    Problem List / ED Course:  Dizziness:  sx are likely due to sinus dysfunction due to Covid.  However, MRI ordered due to hx encephalitis and higher incidence of hypercoagulation with covid.  MRI pending at shift change.   Reevaluation:  After the interventions noted above, I reevaluated the patient and found that they have :improved   Social Determinants of Health:  Lives at home   Dispostion:  Pending at shift change.        Final Clinical Impression(s) / ED Diagnoses Final diagnoses:  COVID-19  Vertigo    Rx / DC Orders ED Discharge Orders     None         Isla Pence, MD 07/13/22 2313

## 2022-07-14 DIAGNOSIS — G319 Degenerative disease of nervous system, unspecified: Secondary | ICD-10-CM | POA: Diagnosis not present

## 2022-07-14 DIAGNOSIS — G9389 Other specified disorders of brain: Secondary | ICD-10-CM | POA: Diagnosis not present

## 2022-07-14 NOTE — ED Provider Notes (Signed)
Patient with recent COVID-19 diagnosis.  Currently has dizziness.  Care assumed pending MRI.  MRI negative for acute CVA.  Discussed incidental finding of sinusitis.  Would not treat with antibiotics at this juncture.  Discussed outpatient follow-up for COVID-19, dizziness.  Return precautions discussed.   Quintella Reichert, MD 07/14/22 [phone]

## 2022-07-16 ENCOUNTER — Inpatient Hospital Stay: Payer: Medicare Other

## 2022-07-18 ENCOUNTER — Inpatient Hospital Stay: Payer: Medicare Other | Admitting: Internal Medicine

## 2022-07-24 IMAGING — CR DG CHEST 2V
2 series · 2 of 2 positions shown · non-contrast
Comparison: 07/02/2018

CLINICAL DATA: Productive cough and chest pain over the last 10
days.

EXAM:
CHEST - 2 VIEW

[w chest pa]
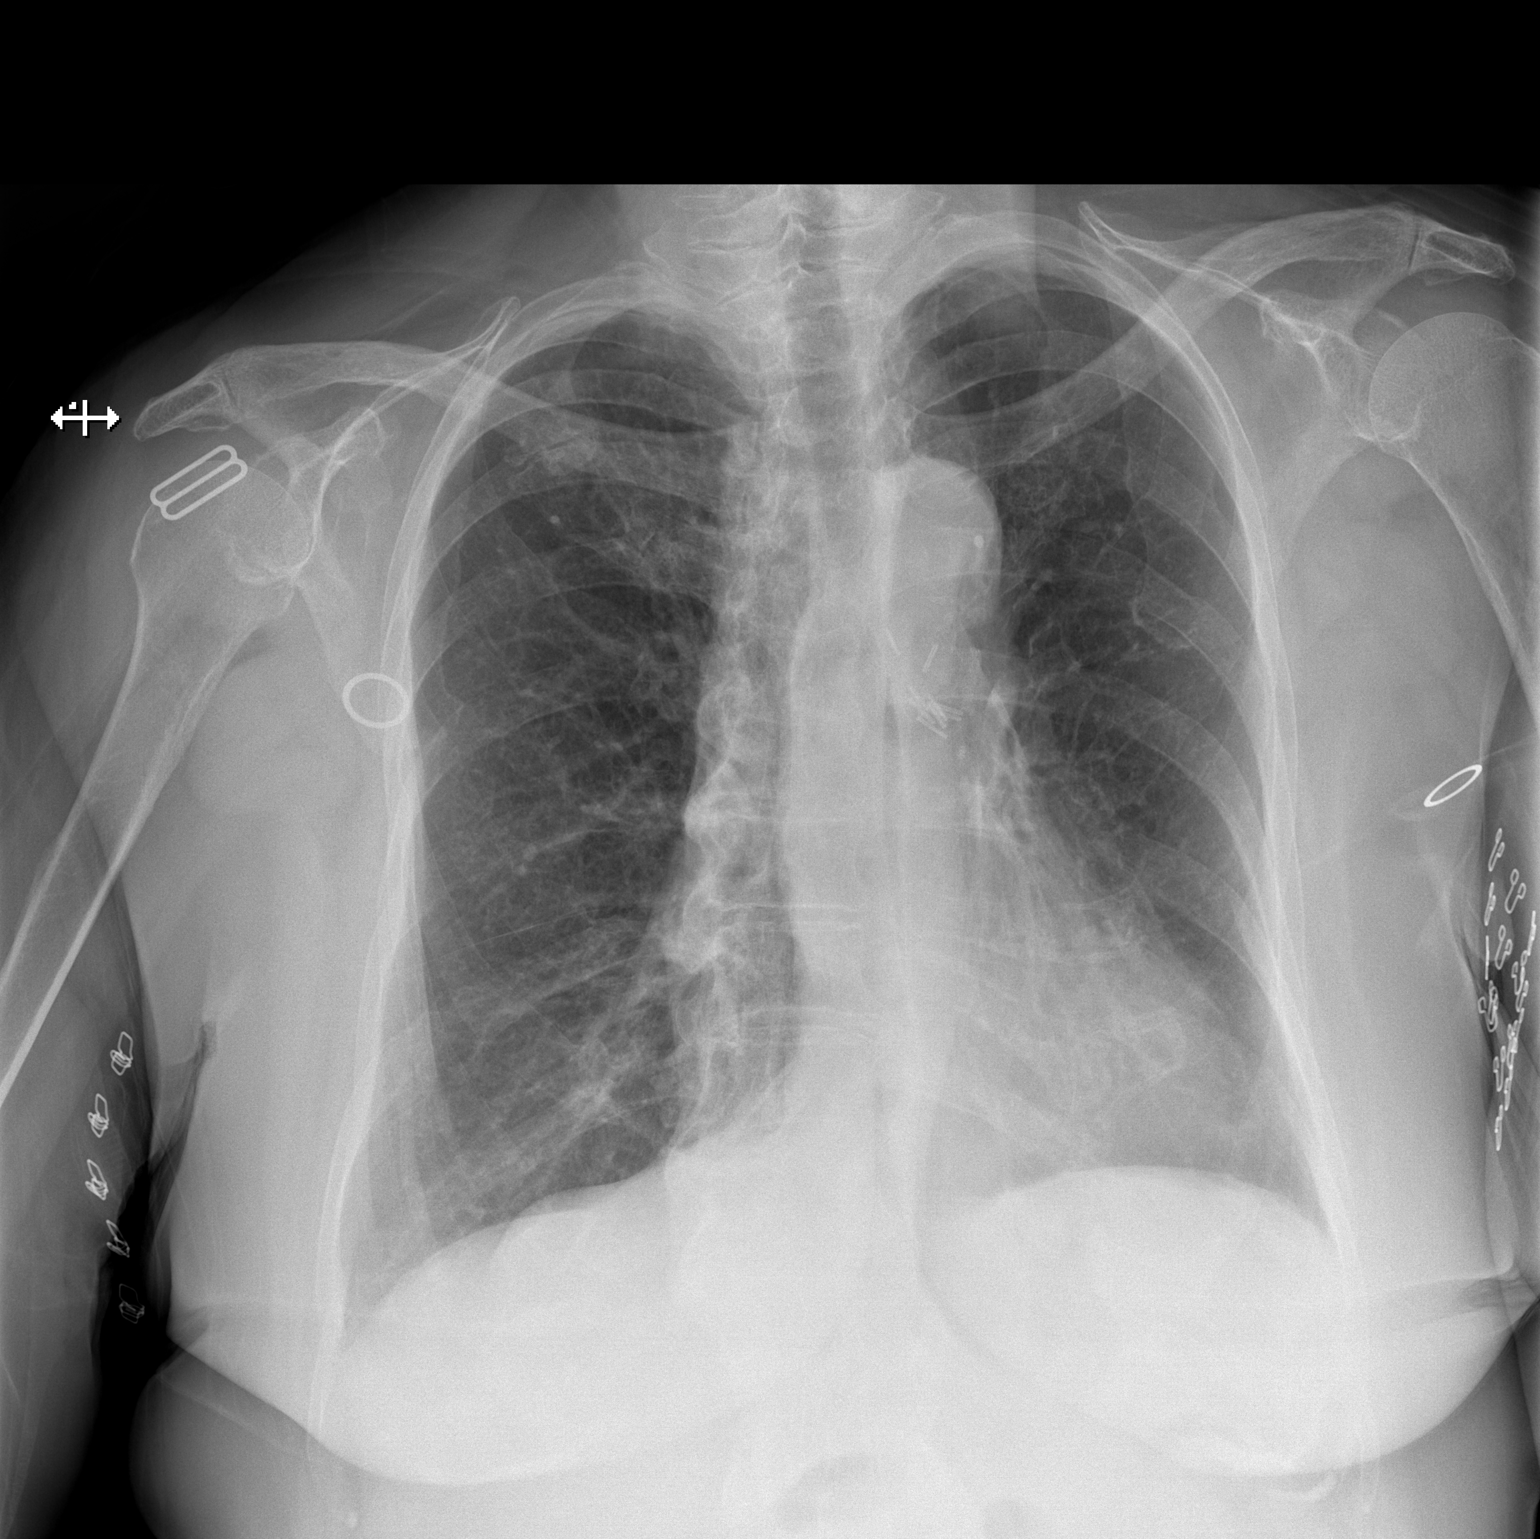

[w chest lat]
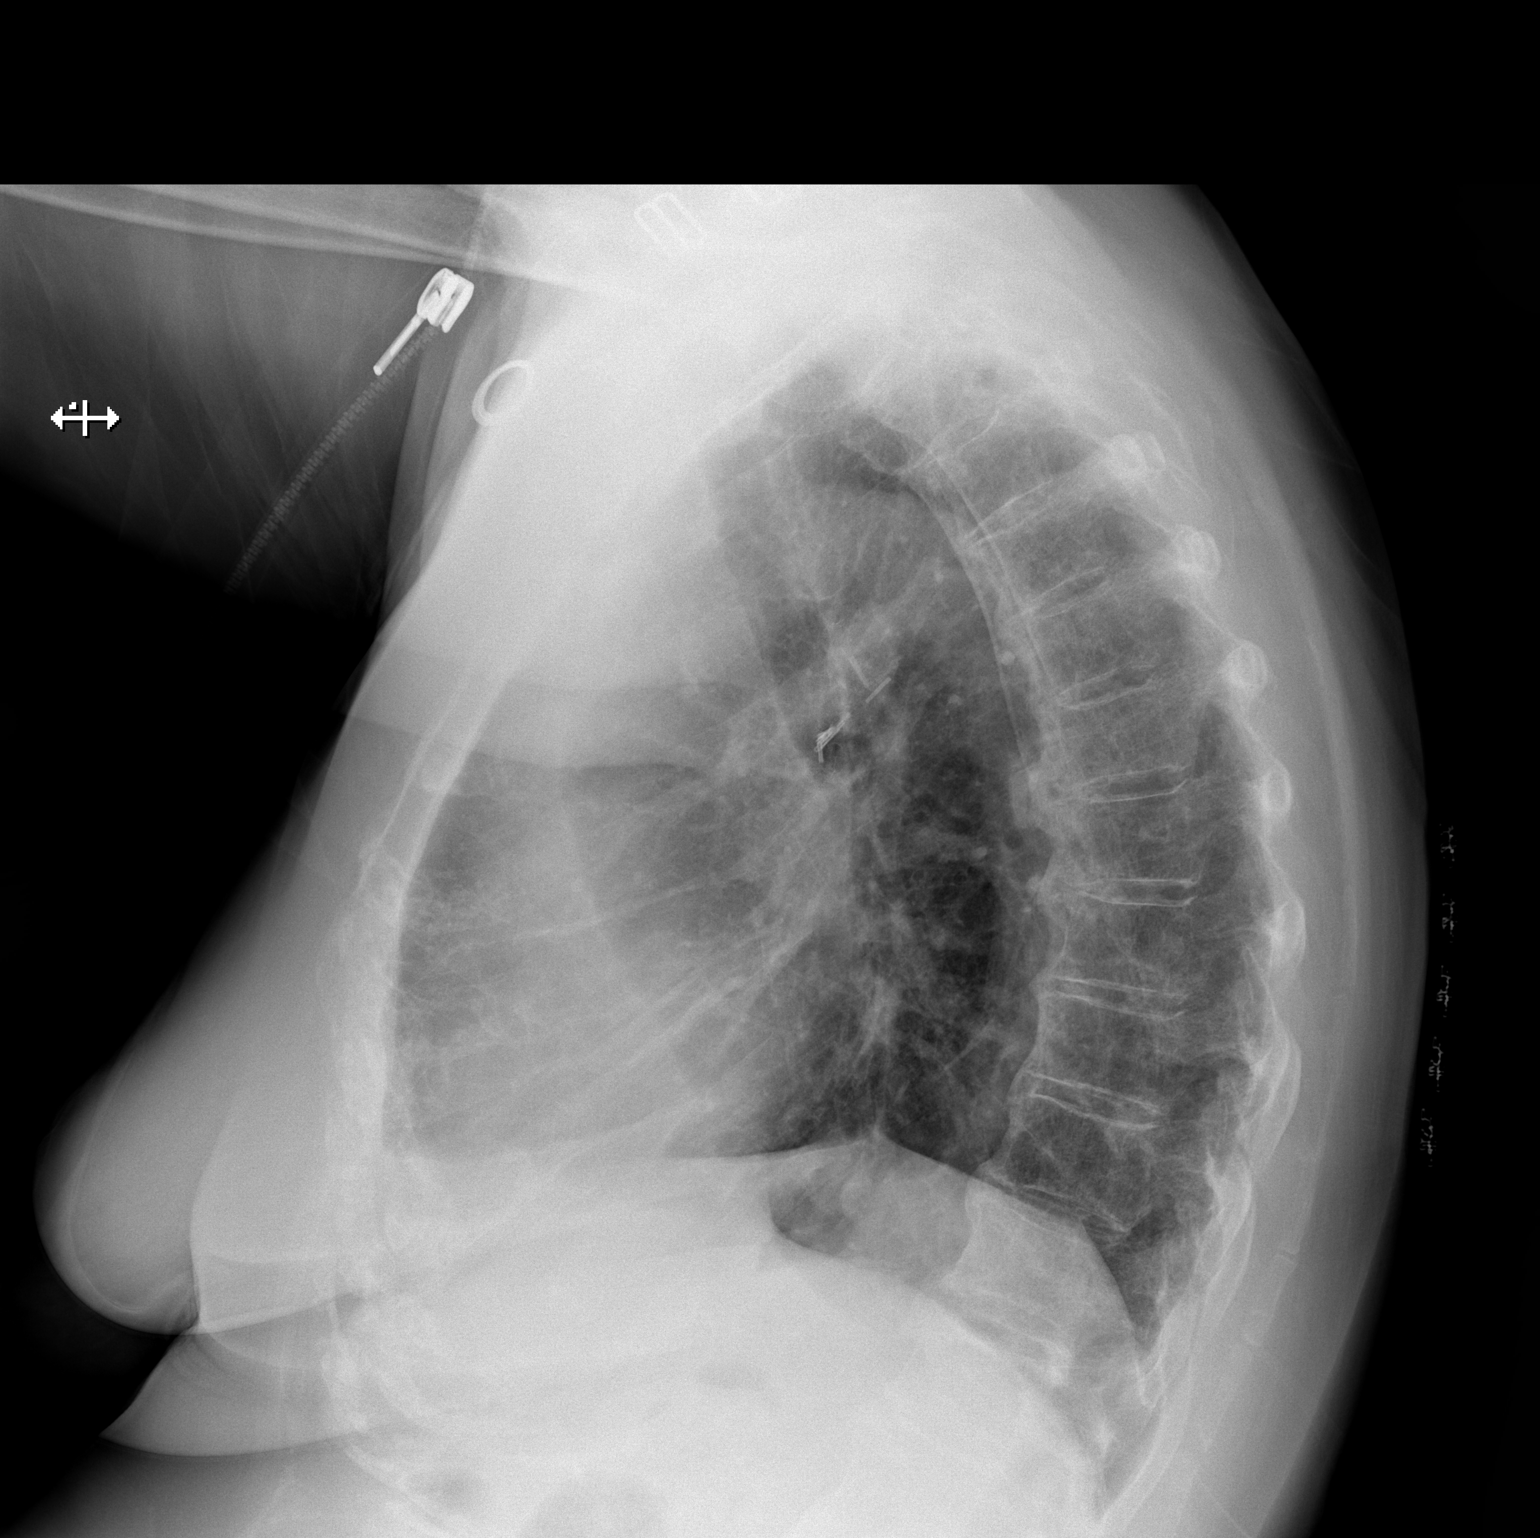

[2 of 2 positions shown; findings below may reference images not displayed]

FINDINGS: Heart size is normal. Chronic aortic atherosclerosis. Surgical clips
in the left hilum, presumed related to previous lobectomy. Chronic
pulmonary scarring. No evidence of pneumonia, collapse or effusion.
IMPRESSION: Chronic lobectomy on the left. Chronic pulmonary scarring. No sign
of pneumonia, collapse or effusion.

## 2022-08-07 ENCOUNTER — Ambulatory Visit (INDEPENDENT_AMBULATORY_CARE_PROVIDER_SITE_OTHER): Payer: Medicare Other | Admitting: Internal Medicine

## 2022-08-07 ENCOUNTER — Encounter: Payer: Self-pay | Admitting: Internal Medicine

## 2022-08-07 VITALS — BP 120/70 | HR 80 | Ht 66.0 in | Wt 193.6 lb

## 2022-08-07 DIAGNOSIS — Z1211 Encounter for screening for malignant neoplasm of colon: Secondary | ICD-10-CM

## 2022-08-07 NOTE — Progress Notes (Signed)
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  Patient presents with her husband to clinic today. Her last colonoscopy was 12 years that was normal. Denies family history of colon cancer. Denies melena, hematochezia, changes in bowel habits, diarrhea, constipation, or weight loss. Denies dysphagia, N&V, or ab pain. 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 Her last seizure was 6-8 months ago, but her seizures are usually controlled on Vimpat and Keppra currently. She is a retired high school Psychologist, prison and probation services.  Past Medical History:  Diagnosis Date   Depression    Encephalitis    Osteoporosis    Seizures ([place])    Past Surgical History:  Procedure Laterality Date   ABDOMINAL HYSTERECTOMY     left lobe lobectomy     SPLENECTOMY, TOTAL     Family History  Problem Relation Age of Onset   High blood pressure Mother    Breast cancer Neg Hx    Social History   Tobacco Use   Smoking status: Never   Smokeless tobacco: Never  Vaping Use   Vaping Use: Never used  Substance Use Topics   Alcohol use: Never   Drug use: Never   Current Outpatient Medications  Medication Sig Dispense Refill   alendronate (FOSAMAX) 70 MG tablet Take 70 mg by mouth once a week. Take with a full glass of water on an empty stomach.     calcium-vitamin D (OSCAL WITH D) 500-200 MG-UNIT tablet Take 1 tablet by mouth.     lacosamide (VIMPAT) 200 MG TABS tablet Take 1 tablet (200 mg total) by mouth 2 (two) times daily. 180 tablet 3   levETIRAcetam (KEPPRA XR) 500 MG 24 hr tablet Take 4 tablets every night 360 tablet 3   LORazepam (ATIVAN) 1 MG tablet Take 1 tablet as needed for cluster of seizures. 10 tablet 5   Multiple Vitamins-Minerals (MULTIVITAMIN WITH MINERALS) tablet Take 1 tablet by mouth  daily.     Vilazodone HCl 20 MG TABS Take 20 mg by mouth daily.     No current facility-administered medications for this visit.   Allergies  Allergen Reactions   Ampicillin Rash    Red maculopapular rash occurred in context of coadministration of ampicillin, vancomycin, and ceftriaxone   Review of Systems: All systems reviewed and negative except where noted in HPI.   Physical Exam: BP 120/70 (BP Location: Left Arm, Patient Position: Sitting, Cuff Size: Normal)   Pulse 80   Ht 5\' 6"  (1.676 m)   Wt 193 lb 9.6 oz (87.8 kg)   SpO2 96%   BMI 31.25 kg/m  Constitutional: Pleasant,well-developed, female in no acute distress. HEENT: Normocephalic and atraumatic. Conjunctivae are normal. No scleral icterus. Cardiovascular: Normal rate, regular rhythm.  Pulmonary/chest: Effort normal and breath sounds normal. No wheezing, rales or rhonchi. Abdominal: Soft, nondistended, nontender. Bowel sounds active throughout. There are no masses palpable. Midline abdominal scar from prior splenectomy. Extremities: No edema Neurological: Alert and oriented to person place and time. Skin: Skin is warm and dry. No rashes noted. Psychiatric: Normal mood and affect. Behavior is normal.  Labs 07/2022: CMP with low albumin of 3. INR nml. CBC with mildly elevated WBC of 11.6  ASSESSMENT AND PLAN: Colon cancer screening Patient presents to discuss colon cancer screening. Her last colonoscopy was 12  years ago, which was normal. I went over the risks (including bleeding, infection, perforation, and alterations in vital signs due to sedation) and benefits (colon cancer prevention) of the colonoscopy procedure in detail with the patient, and she is agreeable to proceeding.  - Colonoscopy [facility]  Christia Reading, MD

## 2022-08-07 NOTE — Patient Instructions (Signed)
_______________________________________________________  If you are age 77 or older, your body mass index should be between 23-30. Your Body mass index is 31.25 kg/m. If this is out of the aforementioned range listed, please consider follow up with your Primary Care Provider. ________________________________________________________  The [facility] providers would like to encourage you to use [facility] to communicate with providers for non-urgent requests or questions.  Due to long hold times on the telephone, sending your provider a message by [facility] may be a faster and more efficient way to get a response.  Please allow 48 business hours for a response.  Please remember that this is for non-urgent requests.  _______________________________________________________  Dennis Bast have been scheduled for a colonoscopy. Please follow written instructions given to you at your visit today.  Please pick up your prep supplies at the pharmacy within the next 1-3 days. If you use inhalers (even only as needed), please bring them with you on the day of your procedure.  Due to recent changes in healthcare laws, you may see the results of your imaging and laboratory studies on MyChart before your provider has had a chance to review them.  We understand that in some cases there may be results that are confusing or concerning to you. Not all laboratory results come back in the same time frame and the provider may be waiting for multiple results in order to interpret others.  Please give [place] 48 hours in order for your provider to thoroughly review all the results before contacting the office for clarification of your results.   Thank you for entrusting me with your care and choosing [facility].  Dr Lorenso Courier

## 2022-08-16 ENCOUNTER — Inpatient Hospital Stay (HOSPITAL_BASED_OUTPATIENT_CLINIC_OR_DEPARTMENT_OTHER): Payer: Medicare Other | Admitting: Internal Medicine

## 2022-08-16 ENCOUNTER — Other Ambulatory Visit: Payer: Self-pay

## 2022-08-16 ENCOUNTER — Inpatient Hospital Stay: Payer: Medicare Other | Attending: Internal Medicine

## 2022-08-16 VITALS — BP 147/78 | HR 68 | Temp 97.9°F | Resp 16 | Wt 193.2 lb

## 2022-08-16 DIAGNOSIS — Z08 Encounter for follow-up examination after completed treatment for malignant neoplasm: Secondary | ICD-10-CM | POA: Insufficient documentation

## 2022-08-16 DIAGNOSIS — Z85110 Personal history of malignant carcinoid tumor of bronchus and lung: Secondary | ICD-10-CM | POA: Diagnosis not present

## 2022-08-16 DIAGNOSIS — C3490 Malignant neoplasm of unspecified part of unspecified bronchus or lung: Secondary | ICD-10-CM

## 2022-08-16 DIAGNOSIS — D3A090 Benign carcinoid tumor of the bronchus and lung: Secondary | ICD-10-CM

## 2022-08-16 LAB — CMP (CANCER CENTER ONLY)
ALT: 10 U/L (ref 0–44)
AST: 14 U/L — ABNORMAL LOW (ref 15–41)
Albumin: 3.7 g/dL (ref 3.5–5.0)
Alkaline Phosphatase: 73 U/L (ref 38–126)
Anion gap: 7 (ref 5–15)
BUN: 15 mg/dL (ref 8–23)
CO2: 28 mmol/L (ref 22–32)
Calcium: 9.4 mg/dL (ref 8.9–10.3)
Chloride: 107 mmol/L (ref 98–111)
Creatinine: 0.7 mg/dL (ref 0.44–1.00)
GFR, Estimated: >60 mL/min (ref >60)
Glucose, Bld: 135 mg/dL — ABNORMAL HIGH (ref 70–99)
Potassium: 4.4 mmol/L (ref 3.5–5.1)
Sodium: 142 mmol/L (ref 135–145)
Total Bilirubin: 0.4 mg/dL (ref 0.3–1.2)
Total Protein: 7.3 g/dL (ref 6.5–8.1)

## 2022-08-16 LAB — CBC WITH DIFFERENTIAL (CANCER CENTER ONLY)
Abs Immature Granulocytes: 0.02 10*3/uL (ref 0.00–0.07)
Basophils Absolute: 0.1 10*3/uL (ref 0.0–0.1)
Basophils Relative: 1 %
Eosinophils Absolute: 0.3 10*3/uL (ref 0.0–0.5)
Eosinophils Relative: 4 %
HCT: 42.2 % (ref 36.0–46.0)
Hemoglobin: 13.9 g/dL (ref 12.0–15.0)
Immature Granulocytes: 0 %
Lymphocytes Relative: 31 %
Lymphs Abs: 2.5 10*3/uL (ref 0.7–4.0)
MCH: 30.4 pg (ref 26.0–34.0)
MCHC: 32.9 g/dL (ref 30.0–36.0)
MCV: 92.3 fL (ref 80.0–100.0)
Monocytes Absolute: 0.6 10*3/uL (ref 0.1–1.0)
Monocytes Relative: 7 %
Neutro Abs: 4.5 10*3/uL (ref 1.7–7.7)
Neutrophils Relative %: 57 %
Platelet Count: 340 10*3/uL (ref 150–400)
RBC: 4.57 MIL/uL (ref 3.87–5.11)
RDW: 13.2 % (ref 11.5–15.5)
WBC Count: 8 10*3/uL (ref 4.0–10.5)
nRBC: 0 % (ref 0.0–0.2)

## 2022-08-16 NOTE — Progress Notes (Signed)
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   PRIOR THERAPY: Status post left upper lobectomy with lymph node dissection.  CURRENT THERAPY: Observation.  INTERVAL HISTORY: Mallory Boone 76 y.o. female returns to the clinic today for follow-up visit.  The patient is feeling fine today with no concerning complaints.  She denied having any current chest pain, shortness of breath, cough or hemoptysis.  She has no nausea, vomiting, diarrhea or constipation.  She has no headache or visual changes.  She was supposed to have repeat CT scan of the chest before this visit but unfortunately the patient missed her appointment.  MEDICAL HISTORY: Past Medical History:  Diagnosis Date   Depression    Encephalitis    Osteoporosis    Seizures ([facility])     ALLERGIES:  is allergic to ampicillin.  MEDICATIONS:  Current Outpatient Medications  Medication Sig Dispense Refill   alendronate (FOSAMAX) 70 MG tablet Take 70 mg by mouth once a week. Take with a full glass of water on an empty stomach.     calcium-vitamin D (OSCAL WITH D) 500-200 MG-UNIT tablet Take 1 tablet by mouth.     lacosamide (VIMPAT) 200 MG TABS tablet Take 1 tablet (200 mg total) by mouth 2 (two) times daily. 180 tablet 3   levETIRAcetam (KEPPRA XR) 500 MG 24 hr tablet Take 4 tablets every night 360 tablet 3   LORazepam (ATIVAN) 1 MG tablet Take 1 tablet as needed for cluster of seizures. 10 tablet 5   Multiple Vitamins-Minerals (MULTIVITAMIN WITH MINERALS) tablet Take 1 tablet by mouth daily.     Vilazodone HCl 20 MG TABS Take 20 mg by mouth daily.     No current facility-administered medications for this visit.    SURGICAL HISTORY:  Past Surgical History:  Procedure Laterality Date   ABDOMINAL  HYSTERECTOMY     left lobe lobectomy     SPLENECTOMY, TOTAL      REVIEW OF SYSTEMS:  A comprehensive review of systems was negative.   PHYSICAL EXAMINATION: General appearance: alert, cooperative, and no distress Head: Normocephalic, without obvious abnormality, atraumatic Neck: no adenopathy, no JVD, supple, symmetrical, trachea midline, and thyroid not enlarged, symmetric, no tenderness/mass/nodules Lymph nodes: Cervical, supraclavicular, and axillary nodes normal. Resp: clear to auscultation bilaterally Back: symmetric, no curvature. ROM normal. No CVA tenderness. Cardio: regular rate and rhythm, S1, S2 normal, no murmur, click, rub or gallop GI: soft, non-tender; bowel sounds normal; no masses,  no organomegaly Extremities: extremities normal, atraumatic, no cyanosis or edema  ECOG PERFORMANCE STATUS: 1 - Symptomatic but completely ambulatory  Blood pressure (!) 147/78, pulse 68, temperature 97.9 F (36.6 C), temperature source Oral, resp. rate 16, weight 193 lb 4 oz (87.7 kg), SpO2 96 %.  LABORATORY DATA: Lab Results  Component Value Date   WBC 8.0 08/16/2022   HGB 13.9 08/16/2022   HCT 42.2 08/16/2022   MCV 92.3 08/16/2022   PLT 340 08/16/2022      Chemistry      Component Value Date/Time   NA 142 08/16/2022 1043   K 4.4 08/16/2022 1043   CL 107 08/16/2022 1043   CO2 28 08/16/2022 1043   BUN 15 08/16/2022 1043   BUN 12 05/27/2019 0000   CREATININE 0.70 08/16/2022 1043  Component Value Date/Time   CALCIUM 9.4 08/16/2022 1043   ALKPHOS 73 08/16/2022 1043   AST 14 (L) 08/16/2022 1043   ALT 10 08/16/2022 1043   BILITOT 0.4 08/16/2022 1043       RADIOGRAPHIC STUDIES: No results found.   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 The patient is currently on observation and  she is feeling fine with no concerning complaints. She was supposed to have repeat CT scan of the chest before this visit but unfortunately it was not scheduled as ordered. I will arrange for the patient to have repeat CT scan of the chest performed next week and if negative I will see her back for follow-up visit in 1 year for evaluation with repeat CT scan of the chest and blood work. The patient was advised to call immediately if she has any other concerning symptoms in the interval. The patient voices understanding of current disease status and treatment options and is in agreement with the current care plan.  All questions were answered. The patient knows to call the clinic with any problems, questions or concerns. We can certainly see the patient much sooner if necessary.  Disclaimer: This note was dictated with voice recognition software. Similar sounding words can inadvertently be transcribed and may not be corrected upon review.

## 2022-08-17 ENCOUNTER — Telehealth: Payer: Self-pay

## 2022-08-17 NOTE — Telephone Encounter (Signed)
   Reason for call: ED-Follow up call   Patient visited [facility] on 07/13/2022   Have you been able to follow up with your primary care physician? - Yes  The patient was or was not able to obtain any needed medicine or equipment. - Was, Yes  Are there diet recommendations that you are having difficulty following? - No  Patient expresses understanding of discharge instructions and education provided has no other needs at this time.   [facility]  [place], [place]  Main Phone: [phone]  E-mail: [email or web address]  Website: [email or web address]

## 2022-08-23 ENCOUNTER — Other Ambulatory Visit (HOSPITAL_COMMUNITY): Payer: Medicare Other

## 2022-08-24 ENCOUNTER — Ambulatory Visit (HOSPITAL_COMMUNITY): Payer: Medicare Other

## 2022-10-16 ENCOUNTER — Encounter: Payer: Medicare Other | Admitting: Gastroenterology

## 2022-10-18 ENCOUNTER — Ambulatory Visit (AMBULATORY_SURGERY_CENTER): Payer: Medicare Other | Admitting: Internal Medicine

## 2022-10-18 ENCOUNTER — Encounter: Payer: Self-pay | Admitting: Internal Medicine

## 2022-10-18 VITALS — BP 145/80 | HR 59 | Temp 94.4°F | Resp 16 | Ht 66.0 in | Wt 193.0 lb

## 2022-10-18 DIAGNOSIS — Z1211 Encounter for screening for malignant neoplasm of colon: Secondary | ICD-10-CM | POA: Diagnosis not present

## 2022-10-18 DIAGNOSIS — D122 Benign neoplasm of ascending colon: Secondary | ICD-10-CM

## 2022-10-18 MED ORDER — SODIUM CHLORIDE 0.9 % IV SOLN: 500.0000 mL | Freq: Once | INTRAVENOUS | Status: DC

## 2022-10-18 NOTE — Progress Notes (Signed)
GASTROENTEROLOGY PROCEDURE H&P NOTE   Primary Care Physician: Tisovec, Fransico Him, MD    Reason for Procedure:   Colon cancer screening  Plan:    Colonoscopy  Patient is appropriate for endoscopic procedure(s) in the ambulatory ([place]) setting.  The nature of the procedure, as well as the risks, benefits, and alternatives were carefully and thoroughly reviewed with the patient. Ample time for discussion and questions allowed. The patient understood, was satisfied, and agreed to proceed.     HPI: Mallory Boone is a 77 y.o. female who presents for colonoscopy for evaluation of colon cancer screening .  Patient was most recently seen in the Gastroenterology Clinic on 08/07/22.  No interval change in medical history since that appointment. Please refer to that note for full details regarding GI history and clinical presentation.   Past Medical History:  Diagnosis Date   COVID 07/13/2022   Depression    Encephalitis    Osteoporosis    Seizures ([place])     Past Surgical History:  Procedure Laterality Date   ABDOMINAL HYSTERECTOMY     left lobe lobectomy     SPLENECTOMY, TOTAL      Prior to Admission medications   Medication Sig Start Date End Date Taking? Authorizing Provider  alendronate (FOSAMAX) 70 MG tablet Take 70 mg by mouth once a week. Take with a full glass of water on an empty stomach.   Yes [provider]  calcium-vitamin D (OSCAL WITH D) 500-200 MG-UNIT tablet Take 1 tablet by mouth.   Yes [provider]  lacosamide (VIMPAT) 200 MG TABS tablet Take 1 tablet (200 mg total) by mouth 2 (two) times daily. 05/15/22  Yes Cameron Sprang, MD  levETIRAcetam (KEPPRA XR) 500 MG 24 hr tablet Take 4 tablets every night 05/15/22  Yes Cameron Sprang, MD  LORazepam (ATIVAN) 1 MG tablet Take 1 tablet as needed for cluster of seizures. 10/10/21  Yes Cameron Sprang, MD  Multiple Vitamins-Minerals (MULTIVITAMIN WITH MINERALS) tablet Take 1 tablet by mouth daily.   Yes  [provider]  Vilazodone HCl 20 MG TABS Take 20 mg by mouth daily.   Yes [provider]    Current Outpatient Medications  Medication Sig Dispense Refill   alendronate (FOSAMAX) 70 MG tablet Take 70 mg by mouth once a week. Take with a full glass of water on an empty stomach.     calcium-vitamin D (OSCAL WITH D) 500-200 MG-UNIT tablet Take 1 tablet by mouth.     lacosamide (VIMPAT) 200 MG TABS tablet Take 1 tablet (200 mg total) by mouth 2 (two) times daily. 180 tablet 3   levETIRAcetam (KEPPRA XR) 500 MG 24 hr tablet Take 4 tablets every night 360 tablet 3   LORazepam (ATIVAN) 1 MG tablet Take 1 tablet as needed for cluster of seizures. 10 tablet 5   Multiple Vitamins-Minerals (MULTIVITAMIN WITH MINERALS) tablet Take 1 tablet by mouth daily.     Vilazodone HCl 20 MG TABS Take 20 mg by mouth daily.     Current Facility-Administered Medications  Medication Dose Route Frequency Provider Last Rate Last Admin   0.9 %  sodium chloride infusion  500 mL Intravenous Once Sharyn Creamer, MD        Allergies as of 10/18/2022 - Review Complete 10/18/2022  Allergen Reaction Noted   Vancomycin Other (See Comments) 10/09/2018   Ampicillin Rash and Other (See Comments) 07/03/2018    Family History  Problem Relation Age of Onset  High blood pressure Mother    Breast cancer Neg Hx     Social History   Socioeconomic History   Marital status: Married    Spouse name: Not on file   Number of children: Not on file   Years of education: College   Highest education level: Not on file  Occupational History   Not on file  Tobacco Use   Smoking status: Never   Smokeless tobacco: Never  Vaping Use   Vaping Use: Never used  Substance and Sexual Activity   Alcohol use: Never   Drug use: Never   Sexual activity: Yes    Birth control/protection: Post-menopausal  Other Topics Concern   Not on file  Social History Narrative   Right handed      Lives with husband       Secretary/administrator edu   Social Determinants of Health   Financial Resource Strain: Not on file  Food Insecurity: Not on file  Transportation Needs: Not on file  Physical Activity: Not on file  Stress: Not on file  Social Connections: Not on file  Intimate Partner Violence: Not on file    Physical Exam: Vital signs in last 24 hours: BP 129/85   Pulse 67   Temp (!) 94.4 F (34.7 C)   Ht 5\' 6"  (1.676 m)   Wt 193 lb (87.5 kg)   SpO2 97%   BMI 31.15 kg/m  GEN: NAD EYE: Sclerae anicteric ENT: MMM CV: Non-tachycardic Pulm: No increased WOB GI: Soft NEURO:  Alert & Oriented   Christia Reading, MD [facility]   10/18/2022 10:01 AM

## 2022-10-18 NOTE — Op Note (Signed)
[facility] Patient Name: Mallory Boone Procedure Date: 10/18/2022 10:04 AM MRN: [id number] Endoscopist: Adline Mango [place] , , [id number] Age: 77 Referring MD:  Date of Birth: 1946/02/09 Gender: Female Account #: [id number] Procedure:                Colonoscopy Indications:              Screening for colorectal malignant neoplasm Medicines:                Monitored Anesthesia Care Procedure:                Pre-Anesthesia Assessment:                           - Prior to the procedure, a History and Physical                            was performed, and patient medications and                            allergies were reviewed. The patient's tolerance of                            previous anesthesia was also reviewed. The risks                            and benefits of the procedure and the sedation                            options and risks were discussed with the patient.                            All questions were answered, and informed consent                            was obtained. Prior Anticoagulants: The patient has                            taken no anticoagulant or antiplatelet agents. ASA                            Grade Assessment: II - A patient with mild systemic                            disease. After reviewing the risks and benefits,                            the patient was deemed in satisfactory condition to                            undergo the procedure.                           After obtaining informed consent, the colonoscope  was passed under direct vision. Throughout the                            procedure, the patient's blood pressure, pulse, and                            oxygen saturations were monitored continuously. The                            CF [id number] #[id number] was introduced through the anus                            and advanced to the the terminal ileum. The                            colonoscopy  was performed without difficulty. The                            patient tolerated the procedure well. The quality                            of the bowel preparation was good. The terminal                            ileum, ileocecal valve, appendiceal orifice, and                            rectum were photographed. Scope In: 10:16:39 AM Scope Out: 10:42:43 AM Scope Withdrawal Time: 0 hours 18 minutes 17 seconds  Total Procedure Duration: 0 hours 26 minutes 4 seconds  Findings:                 The terminal ileum appeared normal.                           Two sessile polyps were found in the ascending                            colon. The polyps were 3 to 9 mm in size. These                            polyps were removed with a cold snare. Resection                            and retrieval were complete.                           Multiple diverticula were found in the sigmoid                            colon.                           Non-bleeding internal hemorrhoids were found during  retroflexion. Complications:            No immediate complications. Estimated Blood Loss:     Estimated blood loss was minimal. Impression:               - The examined portion of the ileum was normal.                           - Two 3 to 9 mm polyps in the ascending colon,                            removed with a cold snare. Resected and retrieved.                           - Diverticulosis in the sigmoid colon.                           - Non-bleeding internal hemorrhoids. Recommendation:           - Discharge patient to home (with escort).                           - Await pathology results.                           - The findings and recommendations were discussed                            with the patient. Dr Georgian Co "[name]" Lorenso Courier,  10/18/2022 10:49:16 AM

## 2022-10-18 NOTE — Patient Instructions (Signed)
YOU HAD AN ENDOSCOPIC PROCEDURE TODAY AT THE [facility]:   Refer to the procedure report that was given to you for any specific questions about what was found during the examination.  If the procedure report does not answer your questions, please call your gastroenterologist to clarify.  If you requested that your care partner not be given the details of your procedure findings, then the procedure report has been included in a sealed envelope for you to review at your convenience later.  YOU SHOULD EXPECT: Some feelings of bloating in the abdomen. Passage of more gas than usual.  Walking can help get rid of the air that was put into your GI tract during the procedure and reduce the bloating. If you had a lower endoscopy (such as a colonoscopy or flexible sigmoidoscopy) you may notice spotting of blood in your stool or on the toilet paper. If you underwent a bowel prep for your procedure, you may not have a normal bowel movement for a few days.  Please Note:  You might notice some irritation and congestion in your nose or some drainage.  This is from the oxygen used during your procedure.  There is no need for concern and it should clear up in a day or so.  SYMPTOMS TO REPORT IMMEDIATELY:  Following lower endoscopy (colonoscopy or flexible sigmoidoscopy):  Excessive amounts of blood in the stool  Significant tenderness or worsening of abdominal pains  Swelling of the abdomen that is new, acute  Fever of 100F or higher  For urgent or emergent issues, a gastroenterologist can be reached at any hour by calling [phone]. Do not use MyChart messaging for urgent concerns.    DIET:  We do recommend a small meal at first, but then you may proceed to your regular diet.  Drink plenty of fluids but you should avoid alcoholic beverages for 24 hours.  ACTIVITY:  You should plan to take it easy for the rest of today and you should NOT DRIVE or use heavy machinery until tomorrow (because of  the sedation medicines used during the test).    FOLLOW UP: Our staff will call the number listed on your records the next business day following your procedure.  We will call around 7:15- 8:00 am to check on you and address any questions or concerns that you may have regarding the information given to you following your procedure. If we do not reach you, we will leave a message.     If any biopsies were taken you will be contacted by phone or by letter within the next 1-3 weeks.  Please call us at [phone] if you have not heard about the biopsies in 3 weeks.    SIGNATURES/CONFIDENTIALITY: You and/or your care partner have signed paperwork which will be entered into your electronic medical record.  These signatures attest to the fact that that the information above on your After Visit Summary has been reviewed and is understood.  Full responsibility of the confidentiality of this discharge information lies with you and/or your care-partner.  

## 2022-10-18 NOTE — Progress Notes (Signed)
Called to room to assist during endoscopic procedure.  Patient ID and intended procedure confirmed with present staff. Received instructions for my participation in the procedure from the performing physician.  

## 2022-10-18 NOTE — Progress Notes (Signed)
To pacu, VSS. Report to [email or web address] 

## 2022-10-18 NOTE — Progress Notes (Signed)
Pt's states no medical or surgical changes since previsit or office visit. 

## 2022-10-19 ENCOUNTER — Telehealth: Payer: Self-pay

## 2022-10-19 NOTE — Telephone Encounter (Signed)
  Follow up Call-     10/18/2022    9:42 AM  Call back number  Post procedure Call Back phone  # [phone]  Permission to leave phone message Yes     Patient questions:  Do you have a fever, pain , or abdominal swelling? No. Pain Score  0 *  Have you tolerated food without any problems? Yes.    Have you been able to return to your normal activities? Yes.    Do you have any questions about your discharge instructions: Diet   No. Medications  No. Follow up visit  No.  Do you have questions or concerns about your Care? No.  Actions: * If pain score is 4 or above: No action needed, pain <4.

## 2022-10-23 ENCOUNTER — Encounter: Payer: Self-pay | Admitting: Internal Medicine

## 2022-11-12 DIAGNOSIS — I700 Atherosclerosis of aorta: Secondary | ICD-10-CM | POA: Diagnosis not present

## 2022-11-12 DIAGNOSIS — M810 Age-related osteoporosis without current pathological fracture: Secondary | ICD-10-CM | POA: Diagnosis not present

## 2022-11-19 DIAGNOSIS — Z0000 Encounter for general adult medical examination without abnormal findings: Secondary | ICD-10-CM | POA: Diagnosis not present

## 2022-11-19 DIAGNOSIS — R82998 Other abnormal findings in urine: Secondary | ICD-10-CM | POA: Diagnosis not present

## 2022-11-19 DIAGNOSIS — Z1339 Encounter for screening examination for other mental health and behavioral disorders: Secondary | ICD-10-CM | POA: Diagnosis not present

## 2022-11-19 DIAGNOSIS — R32 Unspecified urinary incontinence: Secondary | ICD-10-CM | POA: Diagnosis not present

## 2022-11-19 DIAGNOSIS — R413 Other amnesia: Secondary | ICD-10-CM | POA: Diagnosis not present

## 2022-11-19 DIAGNOSIS — I700 Atherosclerosis of aorta: Secondary | ICD-10-CM | POA: Diagnosis not present

## 2022-11-19 DIAGNOSIS — E6609 Other obesity due to excess calories: Secondary | ICD-10-CM | POA: Diagnosis not present

## 2022-11-19 DIAGNOSIS — Z1331 Encounter for screening for depression: Secondary | ICD-10-CM | POA: Diagnosis not present

## 2022-11-19 DIAGNOSIS — Z85118 Personal history of other malignant neoplasm of bronchus and lung: Secondary | ICD-10-CM | POA: Diagnosis not present

## 2022-11-19 DIAGNOSIS — I69998 Other sequelae following unspecified cerebrovascular disease: Secondary | ICD-10-CM | POA: Diagnosis not present

## 2022-11-19 DIAGNOSIS — G0490 Encephalitis and encephalomyelitis, unspecified: Secondary | ICD-10-CM | POA: Diagnosis not present

## 2022-11-19 DIAGNOSIS — Z9081 Acquired absence of spleen: Secondary | ICD-10-CM | POA: Diagnosis not present

## 2022-11-19 DIAGNOSIS — F3341 Major depressive disorder, recurrent, in partial remission: Secondary | ICD-10-CM | POA: Diagnosis not present

## 2022-12-14 ENCOUNTER — Ambulatory Visit (INDEPENDENT_AMBULATORY_CARE_PROVIDER_SITE_OTHER): Payer: Medicare Other | Admitting: Neurology

## 2022-12-14 ENCOUNTER — Encounter: Payer: Self-pay | Admitting: Neurology

## 2022-12-14 VITALS — BP 134/80 | HR 71 | Ht 63.0 in | Wt 194.0 lb

## 2022-12-14 DIAGNOSIS — G40219 Localization-related (focal) (partial) symptomatic epilepsy and epileptic syndromes with complex partial seizures, intractable, without status epilepticus: Secondary | ICD-10-CM | POA: Diagnosis not present

## 2022-12-14 MED ORDER — LEVETIRACETAM ER 500 MG PO TB24: ORAL_TABLET | ORAL | 3 refills | Status: DC

## 2022-12-14 MED ORDER — LACOSAMIDE 200 MG PO TABS: 200.0000 mg | ORAL_TABLET | Freq: Two times a day (BID) | ORAL | 3 refills | Status: DC

## 2022-12-14 NOTE — Patient Instructions (Signed)
Good to see you doing well. Continue all your medications. Follow-up in 8 months, call for any changes.   Seizure Precautions: 1. If medication has been prescribed for you to prevent seizures, take it exactly as directed.  Do not stop taking the medicine without talking to your doctor first, even if you have not had a seizure in a long time.   2. Avoid activities in which a seizure would cause danger to yourself or to others.  Don't operate dangerous machinery, swim alone, or climb in high or dangerous places, such as on ladders, roofs, or girders.  Do not drive unless your doctor says you may.  3. If you have any warning that you may have a seizure, lay down in a safe place where you can't hurt yourself.    4.  No driving for 6 months from last seizure, as per [facility].   Please refer to the following link on the [facility] website for more information: http://[email or web address]/[email or web address]/drivingu.cfm   5.  Maintain good sleep hygiene. Avoid alcohol.  6.  Contact your doctor if you have any problems that may be related to the medicine you are taking.  7.  Call 911 and bring the patient back to the ED if:        A.  The seizure lasts longer than 5 minutes.       B.  The patient doesn't awaken shortly after the seizure  C.  The patient has new problems such as difficulty seeing, speaking or moving  D.  The patient was injured during the seizure  E.  The patient has a temperature over 102 F (39C)  F.  The patient vomited and now is having trouble breathing

## 2022-12-14 NOTE — Progress Notes (Signed)
NEUROLOGY FOLLOW UP OFFICE NOTE  Mallory Boone HN:[id number] 03/08/1946  HISTORY OF PRESENT ILLNESS: I had the pleasure of seeing Mallory Boone in follow-up in the neurology clinic on 12/14/2022.  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 She is again accompanied by her husband who helps supplement the history today.  Records and images were personally reviewed where available.  On her last visit, her husband reported more confusion. 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 She was in the ER in 07/2022 for dizziness soon after Covid infection. Her husband reports she woke up confused and could not really stand up. Repeat MRI brain without contrast no acute changes. He notes that 20 minutes after IV fluid hydration, she was back to baseline. They deny any typical seizures in the past 7 months. No garbled speech. He has not needed the prn lorazepam. She continues on Lacosamide '200mg'$  BID and Levetiracetam ER '500mg'$  4 tabs qhs without side effects. Sometimes she feels a little bit confused but she is glad she writes on her book daily and can look back. She has a resting tremor again on the left hand, no change, it does not affect daily activities. No headaches, dizziness, focal numbness/tingling/weakness, no falls. Sleep and mood are good.    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 She is amnestic of her seizures with no prior warning symptoms. Her husband describes stereotyped episodes where she would start rocking and grimacing, perseverating repeatedly saying "I'm okay, it's okay" for 10 seconds. She would be disoriented after, saying "I'm hot, I'm cold, I  need to use the bathroom, where is it?" Her husband feels they occur more when she is slowing down or when it is quiet (such as in church). He has not seen any nocturnal seizures. She has only had 2 convulsions when she was initially diagnosed with encephalitis in 1996, none since then. He recalls her trying Dilantin and Lamictal in the past. She has been on Keppra XR '2000mg'$  daily for at least 10 years, Vimpat '100mg'$  BID was added on 18 months ago. She was having 2-3 seizures a day, and had a significant reduction with addition of Vimpat. Over the past 6 months, her husband has noticed she would sometimes do a little pedaling of both feet. Her husband reports an average of 2 seizures a week, last seizure was 2 days ago. No associated tongue bite or incontinence. No side effects on medications. She denies any olfactory/gustatory hallucinations, deja vu, rising epigastric sensation, focal numbness/tingling/weakness, myoclonic jerks. She denies any headaches, dizziness, diplopia, dysarthria/dysphagia, neck/back pain, bowel dysfunction. She has urinary frequency. Her hands feels stiff sometimes. She has had cognitive issues since the encephalitis, she does not drive. Her husband manages finances. She has to write things down in a journal daily. She manages her own medications. She is independent with dressing and bathing. They moved to [place] 3 months ago to be closer to family. She said they moved a year ago. Her mother had Alzheimer's disease, her father had Lewy Body dementia.   Diagnostic Data: MRI brain with and without contrast 06/2018 no acute changes, there was a large area of encephalomalacia at the anterior left temporal lobe, mild chronic microvascular disease  greatest at the left frontal horn, ex vacuo dilatation of the left lateral ventricle.  EEG in 06/2018 showed sharp transients in sleep in the right frontal region with phase reversal at F8.    Epilepsy Risk Factors:  Encephalitis in 1996.  Otherwise she had a normal birth and early development.  There is no history of febrile convulsions, significant traumatic brain injury, neurosurgical procedures, or family history of seizures.   Prior AEDs: Dilantin, lamictal   PAST MEDICAL HISTORY: Past Medical History:  Diagnosis Date   COVID 07/13/2022   Depression    Encephalitis    Osteoporosis    Seizures ([place])     MEDICATIONS: Current Outpatient Medications on File Prior to Visit  Medication Sig Dispense Refill   alendronate (FOSAMAX) 70 MG tablet Take 70 mg by mouth once a week. Take with a full glass of water on an empty stomach.     calcium-vitamin D (OSCAL WITH D) 500-200 MG-UNIT tablet Take 1 tablet by mouth.     lacosamide (VIMPAT) 200 MG TABS tablet Take 1 tablet (200 mg total) by mouth 2 (two) times daily. 180 tablet 3   levETIRAcetam (KEPPRA XR) 500 MG 24 hr tablet Take 4 tablets every night 360 tablet 3   LORazepam (ATIVAN) 1 MG tablet Take 1 tablet as needed for cluster of seizures. 10 tablet 5   Multiple Vitamins-Minerals (MULTIVITAMIN WITH MINERALS) tablet Take 1 tablet by mouth daily.     Vilazodone HCl 20 MG TABS Take 20 mg by mouth daily.     No current facility-administered medications on file prior to visit.    ALLERGIES: Allergies  Allergen Reactions   Vancomycin Other (See Comments)   Ampicillin Rash and Other (See Comments)    Red maculopapular rash occurred in context of coadministration of ampicillin, vancomycin, and ceftriaxone    FAMILY HISTORY: Family History  Problem Relation Age of Onset   High blood pressure Mother    Breast cancer Neg Hx     SOCIAL HISTORY: Social History   Socioeconomic History   Marital status: Married    Spouse name: Not on file   Number of children: Not on file   Years of education: College   Highest education level: Not on file  Occupational History   Not on file  Tobacco Use   Smoking status: Never   Smokeless tobacco: Never  Vaping Use   Vaping  Use: Never used  Substance and Sexual Activity   Alcohol use: Never   Drug use: Never   Sexual activity: Yes    Birth control/protection: Post-menopausal  Other Topics Concern   Not on file  Social History Narrative   Right handed      Lives with husband      Secretary/administrator edu   Social Determinants of Health   Financial Resource Strain: Not on file  Food Insecurity: Not on file  Transportation Needs: Not on file  Physical Activity: Not on file  Stress: Not on file  Social Connections: Not on file  Intimate Partner Violence: Not on file     PHYSICAL EXAM: Vitals:   12/14/22 1359  BP: 134/80  Pulse: 71  SpO2: 95%   General: No acute distress Head:  Normocephalic/atraumatic Skin/Extremities: No rash, no edema Neurological Exam: alert and awake. No aphasia or dysarthria. Fund of knowledge is appropriate.  Attention and concentration are normal. Cranial nerves: Pupils equal, round. Extraocular movements intact with no nystagmus. Visual fields full.  No facial asymmetry.  Motor: +cogwheeling on  left (similar to prior). Muscle strength 5/5 throughout with no pronator drift.   Finger to nose testing intact.  Gait slightly wide-based, slow and cautious, pill-rolling tremor on left with ambulation. She has a resting tremor on the left hand similar to prior, no postural or endpoint tremor.   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 Her husband denies any seizures in the past 7 months, they have not needed prn lorazepam. Continue Lacosamide '200mg'$  BID and Keppra XR '500mg'$  4 tabs qhs. She does not drive. Follow-up in 8 months, call for any changes.    Thank you for allowing me to participate in her care.  Please do not hesitate to call for any questions or  concerns.    Ellouise Newer, M.D.   CC: Dr. Osborne Casco

## 2022-12-24 DIAGNOSIS — H903 Sensorineural hearing loss, bilateral: Secondary | ICD-10-CM | POA: Diagnosis not present

## 2023-04-01 ENCOUNTER — Other Ambulatory Visit: Payer: Self-pay | Admitting: Internal Medicine

## 2023-04-01 ENCOUNTER — Encounter: Payer: Self-pay | Admitting: Internal Medicine

## 2023-04-01 DIAGNOSIS — N63 Unspecified lump in unspecified breast: Secondary | ICD-10-CM

## 2023-04-13 ENCOUNTER — Encounter: Payer: Self-pay | Admitting: Neurology

## 2023-04-13 MED ORDER — LACOSAMIDE 200 MG PO TABS: 200.0000 mg | ORAL_TABLET | Freq: Two times a day (BID) | ORAL | 0 refills | Status: DC

## 2023-04-23 ENCOUNTER — Ambulatory Visit
Admission: RE | Admit: 2023-04-23 | Discharge: 2023-04-23 | Disposition: A | Discharge status: Home / Self Care | Payer: Medicare Other | Source: Ambulatory Visit | Attending: Internal Medicine | Admitting: Internal Medicine

## 2023-04-23 DIAGNOSIS — N63 Unspecified lump in unspecified breast: Secondary | ICD-10-CM

## 2023-04-23 DIAGNOSIS — N6311 Unspecified lump in the right breast, upper outer quadrant: Secondary | ICD-10-CM | POA: Diagnosis not present

## 2023-05-27 DIAGNOSIS — R7989 Other specified abnormal findings of blood chemistry: Secondary | ICD-10-CM | POA: Diagnosis not present

## 2023-05-27 DIAGNOSIS — G0490 Encephalitis and encephalomyelitis, unspecified: Secondary | ICD-10-CM | POA: Diagnosis not present

## 2023-05-27 DIAGNOSIS — H9190 Unspecified hearing loss, unspecified ear: Secondary | ICD-10-CM | POA: Diagnosis not present

## 2023-05-27 DIAGNOSIS — I69998 Other sequelae following unspecified cerebrovascular disease: Secondary | ICD-10-CM | POA: Diagnosis not present

## 2023-05-27 DIAGNOSIS — I700 Atherosclerosis of aorta: Secondary | ICD-10-CM | POA: Diagnosis not present

## 2023-05-27 DIAGNOSIS — F3341 Major depressive disorder, recurrent, in partial remission: Secondary | ICD-10-CM | POA: Diagnosis not present

## 2023-05-27 DIAGNOSIS — E6609 Other obesity due to excess calories: Secondary | ICD-10-CM | POA: Diagnosis not present

## 2023-05-27 DIAGNOSIS — G40219 Localization-related (focal) (partial) symptomatic epilepsy and epileptic syndromes with complex partial seizures, intractable, without status epilepticus: Secondary | ICD-10-CM | POA: Diagnosis not present

## 2023-05-27 DIAGNOSIS — Z85118 Personal history of other malignant neoplasm of bronchus and lung: Secondary | ICD-10-CM | POA: Diagnosis not present

## 2023-05-27 DIAGNOSIS — H547 Unspecified visual loss: Secondary | ICD-10-CM | POA: Diagnosis not present

## 2023-05-27 DIAGNOSIS — Z9081 Acquired absence of spleen: Secondary | ICD-10-CM | POA: Diagnosis not present

## 2023-05-27 DIAGNOSIS — M810 Age-related osteoporosis without current pathological fracture: Secondary | ICD-10-CM | POA: Diagnosis not present

## 2023-06-06 DIAGNOSIS — D2272 Melanocytic nevi of left lower limb, including hip: Secondary | ICD-10-CM | POA: Diagnosis not present

## 2023-06-06 DIAGNOSIS — D1801 Hemangioma of skin and subcutaneous tissue: Secondary | ICD-10-CM | POA: Diagnosis not present

## 2023-06-06 DIAGNOSIS — Q825 Congenital non-neoplastic nevus: Secondary | ICD-10-CM | POA: Diagnosis not present

## 2023-06-06 DIAGNOSIS — L820 Inflamed seborrheic keratosis: Secondary | ICD-10-CM | POA: Diagnosis not present

## 2023-06-06 DIAGNOSIS — L578 Other skin changes due to chronic exposure to nonionizing radiation: Secondary | ICD-10-CM | POA: Diagnosis not present

## 2023-06-06 DIAGNOSIS — L821 Other seborrheic keratosis: Secondary | ICD-10-CM | POA: Diagnosis not present

## 2023-06-06 DIAGNOSIS — L814 Other melanin hyperpigmentation: Secondary | ICD-10-CM | POA: Diagnosis not present

## 2023-06-06 DIAGNOSIS — D225 Melanocytic nevi of trunk: Secondary | ICD-10-CM | POA: Diagnosis not present

## 2023-08-13 ENCOUNTER — Ambulatory Visit (HOSPITAL_COMMUNITY)
Admission: RE | Admit: 2023-08-13 | Discharge: 2023-08-13 | Disposition: A | Discharge status: Home / Self Care | Payer: Medicare Other | Source: Ambulatory Visit | Attending: Internal Medicine | Admitting: Internal Medicine

## 2023-08-13 DIAGNOSIS — K449 Diaphragmatic hernia without obstruction or gangrene: Secondary | ICD-10-CM | POA: Diagnosis not present

## 2023-08-13 DIAGNOSIS — C3490 Malignant neoplasm of unspecified part of unspecified bronchus or lung: Secondary | ICD-10-CM | POA: Insufficient documentation

## 2023-08-13 DIAGNOSIS — R590 Localized enlarged lymph nodes: Secondary | ICD-10-CM | POA: Diagnosis not present

## 2023-08-13 DIAGNOSIS — C3492 Malignant neoplasm of unspecified part of left bronchus or lung: Secondary | ICD-10-CM | POA: Diagnosis not present

## 2023-08-13 MED ORDER — IOHEXOL 300 MG/ML  SOLN
75.0000 mL | Freq: Once | INTRAMUSCULAR | Status: AC | PRN
  Administered 2023-08-13: 75 mL via INTRAVENOUS

## 2023-08-16 ENCOUNTER — Ambulatory Visit (INDEPENDENT_AMBULATORY_CARE_PROVIDER_SITE_OTHER): Payer: Medicare Other | Admitting: Neurology

## 2023-08-16 ENCOUNTER — Encounter: Payer: Self-pay | Admitting: Neurology

## 2023-08-16 VITALS — BP 128/66 | HR 77 | Ht 65.0 in | Wt 198.2 lb

## 2023-08-16 DIAGNOSIS — G40219 Localization-related (focal) (partial) symptomatic epilepsy and epileptic syndromes with complex partial seizures, intractable, without status epilepticus: Secondary | ICD-10-CM

## 2023-08-16 DIAGNOSIS — R251 Tremor, unspecified: Secondary | ICD-10-CM

## 2023-08-16 MED ORDER — LACOSAMIDE 200 MG PO TABS: 200.0000 mg | ORAL_TABLET | Freq: Two times a day (BID) | ORAL | 3 refills | Status: DC

## 2023-08-16 MED ORDER — LEVETIRACETAM ER 500 MG PO TB24: ORAL_TABLET | ORAL | 3 refills | Status: DC

## 2023-08-16 NOTE — Progress Notes (Signed)
NEUROLOGY FOLLOW UP OFFICE NOTE  TOPACIO WHITELY [id number] [birth year]  HISTORY OF PRESENT ILLNESS: I had the pleasure of seeing Ciomara Kornacki in follow-up in the neurology clinic on 08/16/2023.  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 She is again accompanied by her husband who helps supplement the history today.  Records and images were personally reviewed where available.  Since her last visit, they are happy to report that she has been doing well with no typical seizures in over a year. She was in the ER in 07/2022 for dizziness and confusion after Covid, MRi brain no acute changes, symptoms improved after IV hydration. No further similar events. They deny any staring/unresponsive episodes, she denies any olfactory/gustatory hallucinations, focal numbness/tingling/weakness, myoclonic jerks. No headaches, dizziness, vision changes, no falls. Tremor is the same, he notices it more when she is carrying something. She sews a couple of hours daily without issues. Mood is really good. Her husband notes her aphasia has gradually degraded over time. Sleep is good. She is on Lacosamide 200mg  BID and Levetiracetam ER 500mg  4 tabs at bedtime (2000mg  at bedtime) without side effects.    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 She is amnestic of her seizures with no prior warning symptoms. Her husband describes stereotyped episodes where she would start rocking and grimacing, perseverating repeatedly saying "I'm okay, it's okay" for 10 seconds. She would be disoriented after, saying "I'm hot, I'm cold, I need to use the bathroom, where is it?" Her husband feels they occur more when she is slowing down or when it is quiet (such as in church). He has not seen any nocturnal seizures. She has only  had 2 convulsions when she was initially diagnosed with encephalitis in 1996, none since then. He recalls her trying Dilantin and Lamictal in the past. She has been on Keppra XR 2000mg  daily for at least 10 years, Vimpat 100mg  BID was added on 18 months ago. She was having 2-3 seizures a day, and had a significant reduction with addition of Vimpat. Over the past 6 months, her husband has noticed she would sometimes do a little pedaling of both feet. Her husband reports an average of 2 seizures a week, last seizure was 2 days ago. No associated tongue bite or incontinence. No side effects on medications. She denies any olfactory/gustatory hallucinations, deja vu, rising epigastric sensation, focal numbness/tingling/weakness, myoclonic jerks. She denies any headaches, dizziness, diplopia, dysarthria/dysphagia, neck/back pain, bowel dysfunction. She has urinary frequency. Her hands feels stiff sometimes. She has had cognitive issues since the encephalitis, she does not drive. Her husband manages finances. She has to write things down in a journal daily. She manages her own medications. She is independent with dressing and bathing. They moved to [place] 3 months ago to be closer to family. She said they moved a year ago. Her mother had Alzheimer's disease, her father had Lewy Body dementia.   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  EEG in 06/2018 showed sharp transients in sleep in the right frontal region with phase reversal at F8.    Epilepsy Risk Factors:  Encephalitis in 1996. Otherwise she had a normal birth and early development.  There is no history of febrile convulsions, significant traumatic brain injury, neurosurgical procedures, or family history of seizures.    Prior AEDs: Dilantin, lamictal   PAST MEDICAL HISTORY: Past Medical History:  Diagnosis Date   COVID 07/13/2022   Depression    Encephalitis    Osteoporosis    Seizures (HCC)     MEDICATIONS: Current Outpatient Medications on File Prior to Visit  Medication Sig Dispense Refill   alendronate (FOSAMAX) 70 MG tablet Take 70 mg by mouth once a week. Take with a full glass of water on an empty stomach.     calcium-vitamin D (OSCAL WITH D) 500-200 MG-UNIT tablet Take 1 tablet by mouth.     lacosamide (VIMPAT) 200 MG TABS tablet Take 1 tablet (200 mg total) by mouth 2 (two) times daily. 10 tablet 0   levETIRAcetam (KEPPRA XR) 500 MG 24 hr tablet Take 4 tablets every night 360 tablet 3   LORazepam (ATIVAN) 1 MG tablet Take 1 tablet as needed for cluster of seizures. 10 tablet 5   Multiple Vitamins-Minerals (MULTIVITAMIN WITH MINERALS) tablet Take 1 tablet by mouth daily.     Vilazodone HCl 20 MG TABS Take 20 mg by mouth daily.     No current facility-administered medications on file prior to visit.    ALLERGIES: Allergies  Allergen Reactions   Vancomycin Other (See Comments)   Ampicillin Rash and Other (See Comments)    Red maculopapular rash occurred in context of coadministration of ampicillin, vancomycin, and ceftriaxone    FAMILY HISTORY: Family History  Problem Relation Age of Onset   High blood pressure Mother    Breast cancer Neg Hx     SOCIAL HISTORY: Social History   Socioeconomic History   Marital status: Married    Spouse name: Not on file   Number of children: Not on file   Years of education: College   Highest education level: Not on file  Occupational History   Not on file  Tobacco Use   Smoking status: Never   Smokeless tobacco: Never  Vaping Use   Vaping status: Never Used  Substance and Sexual Activity   Alcohol use: Never   Drug use: Never   Sexual activity: Yes    Birth control/protection: Post-menopausal  Other Topics Concern   Not on file   Social History Narrative   Right handed      Lives with husband      Automotive engineer edu   Social Determinants of Health   Financial Resource Strain: Not on file  Food Insecurity: Not on file  Transportation Needs: Not on file  Physical Activity: Not on file  Stress: Not on file  Social Connections: Not on file  Intimate Partner Violence: Not on file     PHYSICAL EXAM: Vitals:   08/16/23 1404  BP: 128/66  Pulse: 77  SpO2: 97%   General: No acute distress Head:  Normocephalic/atraumatic Skin/Extremities: No rash, no edema Neurological Exam: alert and awake. No aphasia or dysarthria. Fund of knowledge is appropriate.  Attention and concentration are normal.   Cranial nerves: Pupils equal, round. Extraocular movements intact with no nystagmus. Visual fields full.  No facial asymmetry.  Motor: +cogwheeling L>R, muscle strength 5/5 throughout with no pronator drift.   Finger to nose testing intact.  Gait slightly wide-based, slow and cautious, no ataxia. She again has a resting tremor more noticeable on the left hand, slight right  5th digit resting tremor occasionally. No postural or endpoint tremor.    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 She has been doing well seizure-free in over a year, continue Lacosamide 200mg  BID and Levetiracetam ER 500mg  4 tabs at bedtime, refills sent. She has not needed prn lorazepam, they will call for refills. She does not drive. Resting tremor stable, continue to monitor. Follow-up in 1 year, call for any changes.    Thank you for allowing me to participate in her care.  Please do not hesitate to call for any questions or concerns.    Patrcia Dolly, M.D.   CC: Dr. Wylene Simmer

## 2023-08-16 NOTE — Patient Instructions (Signed)
Good to see you doing well. Continue all your medications. Follow-up in 1 year, call for any changes.    Seizure Precautions: 1. If medication has been prescribed for you to prevent seizures, take it exactly as directed.  Do not stop taking the medicine without talking to your doctor first, even if you have not had a seizure in a long time.   2. Avoid activities in which a seizure would cause danger to yourself or to others.  Don't operate dangerous machinery, swim alone, or climb in high or dangerous places, such as on ladders, roofs, or girders.  Do not drive unless your doctor says you may.  3. If you have any warning that you may have a seizure, lay down in a safe place where you can't hurt yourself.    4.  No driving for 6 months from last seizure, as per [facility].   Please refer to the following link on the [employer]ile[employer] of [employer]'s website for more information: http://www.epilepsyfoundation.org/answerplace/Social/driving/drivingu.cfm   5.  Maintain good sleep hygiene.  6.  Contact your doctor if you have any problems that may be related to the medicine you are taking.  7.  Call 911 and bring the patient back to the ED if:        A.  The seizure lasts longer than 5 minutes.       B.  The patient doesn't awaken shortly after the seizure  C.  The patient has new problems such as difficulty seeing, speaking or moving  D.  The patient was injured during the seizure  E.  The patient has a temperature over 102 F (39C)  F.  The patient vomited and now is having trouble breathing

## 2023-08-22 ENCOUNTER — Other Ambulatory Visit: Payer: Self-pay

## 2023-08-22 DIAGNOSIS — C3490 Malignant neoplasm of unspecified part of unspecified bronchus or lung: Secondary | ICD-10-CM

## 2023-08-23 ENCOUNTER — Inpatient Hospital Stay: Payer: Medicare Other | Attending: Internal Medicine

## 2023-08-23 DIAGNOSIS — Z85110 Personal history of malignant carcinoid tumor of bronchus and lung: Secondary | ICD-10-CM | POA: Insufficient documentation

## 2023-08-23 DIAGNOSIS — Z08 Encounter for follow-up examination after completed treatment for malignant neoplasm: Secondary | ICD-10-CM | POA: Insufficient documentation

## 2023-08-23 DIAGNOSIS — M810 Age-related osteoporosis without current pathological fracture: Secondary | ICD-10-CM | POA: Insufficient documentation

## 2023-08-23 DIAGNOSIS — C3490 Malignant neoplasm of unspecified part of unspecified bronchus or lung: Secondary | ICD-10-CM

## 2023-08-23 LAB — CMP (CANCER CENTER ONLY)
ALT: 10 U/L (ref 0–44)
AST: 14 U/L — ABNORMAL LOW (ref 15–41)
Albumin: 3.7 g/dL (ref 3.5–5.0)
Alkaline Phosphatase: 68 U/L (ref 38–126)
Anion gap: 5 (ref 5–15)
BUN: 13 mg/dL (ref 8–23)
CO2: 31 mmol/L (ref 22–32)
Calcium: 9.1 mg/dL (ref 8.9–10.3)
Chloride: 105 mmol/L (ref 98–111)
Creatinine: 0.64 mg/dL (ref 0.44–1.00)
GFR, Estimated: >60 mL/min (ref >60)
Glucose, Bld: 101 mg/dL — ABNORMAL HIGH (ref 70–99)
Potassium: 4 mmol/L (ref 3.5–5.1)
Sodium: 141 mmol/L (ref 135–145)
Total Bilirubin: 0.5 mg/dL (ref <1.2)
Total Protein: 7.3 g/dL (ref 6.5–8.1)

## 2023-08-23 LAB — CBC WITH DIFFERENTIAL (CANCER CENTER ONLY)
Abs Immature Granulocytes: 0.02 10*3/uL (ref 0.00–0.07)
Basophils Absolute: 0.1 10*3/uL (ref 0.0–0.1)
Basophils Relative: 1 %
Eosinophils Absolute: 0.5 10*3/uL (ref 0.0–0.5)
Eosinophils Relative: 5 %
HCT: 44.5 % (ref 36.0–46.0)
Hemoglobin: 14 g/dL (ref 12.0–15.0)
Immature Granulocytes: 0 %
Lymphocytes Relative: 32 %
Lymphs Abs: 3.1 10*3/uL (ref 0.7–4.0)
MCH: 30 pg (ref 26.0–34.0)
MCHC: 31.5 g/dL (ref 30.0–36.0)
MCV: 95.5 fL (ref 80.0–100.0)
Monocytes Absolute: 0.7 10*3/uL (ref 0.1–1.0)
Monocytes Relative: 7 %
Neutro Abs: 5.2 10*3/uL (ref 1.7–7.7)
Neutrophils Relative %: 55 %
Platelet Count: 329 10*3/uL (ref 150–400)
RBC: 4.66 MIL/uL (ref 3.87–5.11)
RDW: 13.1 % (ref 11.5–15.5)
WBC Count: 9.5 10*3/uL (ref 4.0–10.5)
nRBC: 0 % (ref 0.0–0.2)

## 2023-08-26 ENCOUNTER — Inpatient Hospital Stay (HOSPITAL_BASED_OUTPATIENT_CLINIC_OR_DEPARTMENT_OTHER): Payer: Medicare Other | Admitting: Internal Medicine

## 2023-08-26 VITALS — BP 119/60 | HR 78 | Temp 98.0°F | Resp 17 | Ht 65.0 in | Wt 195.2 lb

## 2023-08-26 DIAGNOSIS — Z08 Encounter for follow-up examination after completed treatment for malignant neoplasm: Secondary | ICD-10-CM | POA: Diagnosis not present

## 2023-08-26 DIAGNOSIS — M810 Age-related osteoporosis without current pathological fracture: Secondary | ICD-10-CM | POA: Diagnosis not present

## 2023-08-26 DIAGNOSIS — D3A090 Benign carcinoid tumor of the bronchus and lung: Secondary | ICD-10-CM

## 2023-08-26 DIAGNOSIS — Z85110 Personal history of malignant carcinoid tumor of bronchus and lung: Secondary | ICD-10-CM | POA: Diagnosis not present

## 2023-08-26 NOTE — Progress Notes (Signed)
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   PRIOR THERAPY: Status post left upper lobectomy with lymph node dissection.  CURRENT THERAPY: Observation.  INTERVAL HISTORY: Mallory Boone 77 y.o. female returns to the clinic today for annual follow-up visit. Discussed the use of AI scribe software for clinical note transcription with the patient, who gave verbal consent to proceed.  History of Present Illness   The patient, a 77 year old individual with a history of stage 2A carcinoid tumor, underwent a left upper lobectomy seven years ago. Since then, she has been under regular surveillance with annual CT scans. The patient reports no new symptoms or changes in health status over the past year. She denies experiencing chest pain, breathing difficulties, nausea, vomiting, diarrhea, or weight loss. The patient is compliant with her medication regimen, which includes Fosamax. She has expressed a desire for weight loss but reports no significant changes in weight. The patient has been feeling well overall and is satisfied with her current state of health.       MEDICAL HISTORY: Past Medical History:  Diagnosis Date   COVID 07/13/2022   Depression    Encephalitis    Osteoporosis    Seizures (HCC)     ALLERGIES:  is allergic to vancomycin and ampicillin.  MEDICATIONS:  Current Outpatient Medications  Medication Sig Dispense Refill   alendronate (FOSAMAX) 70 MG tablet Take 70 mg by mouth once a week. Take with a full glass of water on an empty stomach.     calcium-vitamin D (OSCAL WITH D) 500-200 MG-UNIT tablet Take 1 tablet by mouth.     lacosamide (VIMPAT) 200 MG TABS tablet Take 1 tablet (200 mg total) by mouth 2 (two) times daily. 180 tablet 3    levETIRAcetam (KEPPRA XR) 500 MG 24 hr tablet Take 4 tablets every night 360 tablet 3   LORazepam (ATIVAN) 1 MG tablet Take 1 tablet as needed for cluster of seizures. 10 tablet 5   Multiple Vitamins-Minerals (MULTIVITAMIN WITH MINERALS) tablet Take 1 tablet by mouth daily.     Vilazodone HCl 20 MG TABS Take 20 mg by mouth daily.     No current facility-administered medications for this visit.    SURGICAL HISTORY:  Past Surgical History:  Procedure Laterality Date   ABDOMINAL HYSTERECTOMY     left lobe lobectomy     SPLENECTOMY, TOTAL      REVIEW OF SYSTEMS:  A comprehensive review of systems was negative.   PHYSICAL EXAMINATION: General appearance: alert, cooperative, and no distress Head: Normocephalic, without obvious abnormality, atraumatic Neck: no adenopathy, no JVD, supple, symmetrical, trachea midline, and thyroid not enlarged, symmetric, no tenderness/mass/nodules Lymph nodes: Cervical, supraclavicular, and axillary nodes normal. Resp: clear to auscultation bilaterally Back: symmetric, no curvature. ROM normal. No CVA tenderness. Cardio: regular rate and rhythm, S1, S2 normal, no murmur, click, rub or gallop GI: soft, non-tender; bowel sounds normal; no masses,  no organomegaly Extremities: extremities normal, atraumatic, no cyanosis or edema  ECOG PERFORMANCE STATUS: 1 - Symptomatic but completely ambulatory  Blood pressure 119/60, pulse 78, temperature 98 F (36.7 C), temperature source Temporal, resp. rate 17, height 5\' 5"  (1.651 m), weight 195 lb 3.2 oz (88.5 kg), SpO2 96%.  LABORATORY DATA: Lab Results  Component Value Date   WBC  9.5 08/23/2023   HGB 14.0 08/23/2023   HCT 44.5 08/23/2023   MCV 95.5 08/23/2023   PLT 329 08/23/2023      Chemistry      Component Value Date/Time   NA 141 08/23/2023 0851   K 4.0 08/23/2023 0851   CL 105 08/23/2023 0851   CO2 31 08/23/2023 0851   BUN 13 08/23/2023 0851   BUN 12 05/27/2019 0000   CREATININE 0.64 08/23/2023  0851      Component Value Date/Time   CALCIUM 9.1 08/23/2023 0851   ALKPHOS 68 08/23/2023 0851   AST 14 (L) 08/23/2023 0851   ALT 10 08/23/2023 0851   BILITOT 0.5 08/23/2023 0851       RADIOGRAPHIC STUDIES: CT Chest W Contrast  Result Date: 08/26/2023 CLINICAL DATA:  Non-small cell lung cancer. LEFT lung cancer diagnosis 2018 with resection. * Tracking Code: BO * EXAM: CT CHEST WITH CONTRAST TECHNIQUE: Multidetector CT imaging of the chest was performed during intravenous contrast administration. RADIATION DOSE REDUCTION: This exam was performed according to the departmental dose-optimization program which includes automated exposure control, adjustment of the mA and/or kV according to patient size and/or use of iterative reconstruction technique. CONTRAST:  75mL OMNIPAQUE IOHEXOL 300 MG/ML  SOLN COMPARISON:  CT 07/19/2021, 07/18/2020 FINDINGS: Cardiovascular: No significant vascular findings. Normal heart size. No pericardial effusion. Mediastinum/Nodes: Precarinal lymph node measuring 14 mm is new from comparison exam. RIGHT lower paratracheal node measures 9 mm. These nodes are demonstrated on more remote scan 07/18/2020. No supraclavicular adenopathy. No hilar adenopathy Lungs/Pleura: Linear band parenchymal thickening in the LEFT lung is unchanged from the prior surgery. Patient status post LEFT upper lobectomy no new nodularity. RIGHT lung clear. Lobectomy. Upper Abdomen: Limited view of the liver, kidneys, pancreas are unremarkable. Normal adrenal glands. Multiple small splenules in the LEFT upper quadrant small hiatal hernia. Musculoskeletal: No aggressive osseous lesion. IMPRESSION: 1. Post LEFT upper lobectomy without evidence of local recurrence within the LEFT lung. 2. Mild paratracheal adenopathy similar to CT scan 07/18/2020. Electronically Signed   By: Genevive Bi M.D.   On: 08/26/2023 08:20     ASSESSMENT AND PLAN: This is a very pleasant 77 years old white female with stage  IIA low-grade neuroendocrine carcinoma, carcinoid tumor diagnosed in February 2018 status post left upper lobectomy with lymph node dissection. The patient has been on observation since that time. She had repeat CT scan of the chest performed recently.  I personally and independently reviewed the scan and discussed the results with her today.  Her scan showed no concerning findings for disease recurrence or metastasis.    Stage 2A Carcinoid Tumor (Post-Resection) Diagnosed with stage 2A carcinoid tumor in February 2018. Underwent left upper lobe resection. Annual CT scans have shown no recurrence or new growths. Latest CT scan remains clear. No new symptoms reported. Decision made to discharge from oncology follow-up due to well-managed condition over nearly seven years. Patient agrees with the decision and will follow up with family doctor for routine monitoring. - Discharge from oncology clinic - Follow up with family doctor, Dr. Neale Burly, for routine monitoring  Osteoporosis Patient is taking Fosamax, indicating a diagnosis of osteoporosis. No new symptoms or changes in medication reported. - Continue current medication regimen (Fosamax)  General Health Maintenance Patient reports no new symptoms or changes in health status. Physical exam unremarkable. - Provide copy of CT scan and medication list - No need for future oncology appointments unless new symptoms arise.   The patient was  advised to call immediately if she has any concerning symptoms. The patient voices understanding of current disease status and treatment options and is in agreement with the current care plan.  All questions were answered. The patient knows to call the clinic with any problems, questions or concerns. We can certainly see the patient much sooner if necessary.  Disclaimer: This note was dictated with voice recognition software. Similar sounding words can inadvertently be transcribed and may not be corrected upon  review.

## 2023-11-20 DIAGNOSIS — M810 Age-related osteoporosis without current pathological fracture: Secondary | ICD-10-CM | POA: Diagnosis not present

## 2023-11-20 DIAGNOSIS — R7989 Other specified abnormal findings of blood chemistry: Secondary | ICD-10-CM | POA: Diagnosis not present

## 2023-11-20 DIAGNOSIS — Z79899 Other long term (current) drug therapy: Secondary | ICD-10-CM | POA: Diagnosis not present

## 2023-11-28 DIAGNOSIS — E6609 Other obesity due to excess calories: Secondary | ICD-10-CM | POA: Diagnosis not present

## 2023-11-28 DIAGNOSIS — Z1339 Encounter for screening examination for other mental health and behavioral disorders: Secondary | ICD-10-CM | POA: Diagnosis not present

## 2023-11-28 DIAGNOSIS — Z1331 Encounter for screening for depression: Secondary | ICD-10-CM | POA: Diagnosis not present

## 2023-11-28 DIAGNOSIS — G0490 Encephalitis and encephalomyelitis, unspecified: Secondary | ICD-10-CM | POA: Diagnosis not present

## 2023-11-28 DIAGNOSIS — F3341 Major depressive disorder, recurrent, in partial remission: Secondary | ICD-10-CM | POA: Diagnosis not present

## 2023-11-28 DIAGNOSIS — Z85118 Personal history of other malignant neoplasm of bronchus and lung: Secondary | ICD-10-CM | POA: Diagnosis not present

## 2023-11-28 DIAGNOSIS — Z0000 Encounter for general adult medical examination without abnormal findings: Secondary | ICD-10-CM | POA: Diagnosis not present

## 2023-11-28 DIAGNOSIS — I700 Atherosclerosis of aorta: Secondary | ICD-10-CM | POA: Diagnosis not present

## 2023-11-28 DIAGNOSIS — H547 Unspecified visual loss: Secondary | ICD-10-CM | POA: Diagnosis not present

## 2023-11-28 DIAGNOSIS — I69998 Other sequelae following unspecified cerebrovascular disease: Secondary | ICD-10-CM | POA: Diagnosis not present

## 2023-11-28 DIAGNOSIS — Z9081 Acquired absence of spleen: Secondary | ICD-10-CM | POA: Diagnosis not present

## 2023-11-28 DIAGNOSIS — G40219 Localization-related (focal) (partial) symptomatic epilepsy and epileptic syndromes with complex partial seizures, intractable, without status epilepticus: Secondary | ICD-10-CM | POA: Diagnosis not present

## 2023-11-28 DIAGNOSIS — R82998 Other abnormal findings in urine: Secondary | ICD-10-CM | POA: Diagnosis not present

## 2023-11-28 DIAGNOSIS — M810 Age-related osteoporosis without current pathological fracture: Secondary | ICD-10-CM | POA: Diagnosis not present

## 2023-11-28 DIAGNOSIS — H9190 Unspecified hearing loss, unspecified ear: Secondary | ICD-10-CM | POA: Diagnosis not present

## 2024-05-25 ENCOUNTER — Other Ambulatory Visit: Payer: Self-pay | Admitting: Internal Medicine

## 2024-05-25 DIAGNOSIS — Z1231 Encounter for screening mammogram for malignant neoplasm of breast: Secondary | ICD-10-CM

## 2024-05-26 ENCOUNTER — Inpatient Hospital Stay
Admission: RE | Admit: 2024-05-26 | Discharge: 2024-05-26 | Discharge status: Home / Self Care | Source: Ambulatory Visit | Attending: Internal Medicine | Admitting: Internal Medicine

## 2024-05-26 DIAGNOSIS — Z1231 Encounter for screening mammogram for malignant neoplasm of breast: Secondary | ICD-10-CM

## 2024-05-28 DIAGNOSIS — G0490 Encephalitis and encephalomyelitis, unspecified: Secondary | ICD-10-CM | POA: Diagnosis not present

## 2024-05-28 DIAGNOSIS — I700 Atherosclerosis of aorta: Secondary | ICD-10-CM | POA: Diagnosis not present

## 2024-05-28 DIAGNOSIS — Z9081 Acquired absence of spleen: Secondary | ICD-10-CM | POA: Diagnosis not present

## 2024-05-28 DIAGNOSIS — Z23 Encounter for immunization: Secondary | ICD-10-CM | POA: Diagnosis not present

## 2024-05-28 DIAGNOSIS — H547 Unspecified visual loss: Secondary | ICD-10-CM | POA: Diagnosis not present

## 2024-05-28 DIAGNOSIS — H9190 Unspecified hearing loss, unspecified ear: Secondary | ICD-10-CM | POA: Diagnosis not present

## 2024-05-28 DIAGNOSIS — I69998 Other sequelae following unspecified cerebrovascular disease: Secondary | ICD-10-CM | POA: Diagnosis not present

## 2024-05-28 DIAGNOSIS — M810 Age-related osteoporosis without current pathological fracture: Secondary | ICD-10-CM | POA: Diagnosis not present

## 2024-05-28 DIAGNOSIS — Z85118 Personal history of other malignant neoplasm of bronchus and lung: Secondary | ICD-10-CM | POA: Diagnosis not present

## 2024-05-28 DIAGNOSIS — E6609 Other obesity due to excess calories: Secondary | ICD-10-CM | POA: Diagnosis not present

## 2024-05-28 DIAGNOSIS — F3341 Major depressive disorder, recurrent, in partial remission: Secondary | ICD-10-CM | POA: Diagnosis not present

## 2024-05-28 DIAGNOSIS — G40219 Localization-related (focal) (partial) symptomatic epilepsy and epileptic syndromes with complex partial seizures, intractable, without status epilepticus: Secondary | ICD-10-CM | POA: Diagnosis not present

## 2024-06-23 DIAGNOSIS — L821 Other seborrheic keratosis: Secondary | ICD-10-CM | POA: Diagnosis not present

## 2024-06-23 DIAGNOSIS — D225 Melanocytic nevi of trunk: Secondary | ICD-10-CM | POA: Diagnosis not present

## 2024-06-23 DIAGNOSIS — Q825 Congenital non-neoplastic nevus: Secondary | ICD-10-CM | POA: Diagnosis not present

## 2024-06-23 DIAGNOSIS — L814 Other melanin hyperpigmentation: Secondary | ICD-10-CM | POA: Diagnosis not present

## 2024-06-23 DIAGNOSIS — D2272 Melanocytic nevi of left lower limb, including hip: Secondary | ICD-10-CM | POA: Diagnosis not present

## 2024-06-23 DIAGNOSIS — L578 Other skin changes due to chronic exposure to nonionizing radiation: Secondary | ICD-10-CM | POA: Diagnosis not present

## 2024-08-17 ENCOUNTER — Ambulatory Visit: Payer: Medicare Other | Admitting: Neurology

## 2024-08-17 ENCOUNTER — Encounter: Payer: Self-pay | Admitting: Neurology

## 2024-08-17 VITALS — BP 144/83 | HR 77 | Ht 63.0 in | Wt 194.8 lb

## 2024-08-17 DIAGNOSIS — R251 Tremor, unspecified: Secondary | ICD-10-CM

## 2024-08-17 DIAGNOSIS — G40219 Localization-related (focal) (partial) symptomatic epilepsy and epileptic syndromes with complex partial seizures, intractable, without status epilepticus: Secondary | ICD-10-CM | POA: Diagnosis not present

## 2024-08-17 MED ORDER — LEVETIRACETAM ER 500 MG PO TB24: ORAL_TABLET | ORAL | 4 refills | Status: AC

## 2024-08-17 MED ORDER — LACOSAMIDE 200 MG PO TABS: 200.0000 mg | ORAL_TABLET | Freq: Two times a day (BID) | ORAL | 4 refills | Status: AC

## 2024-08-17 NOTE — Progress Notes (Signed)
 NEUROLOGY FOLLOW UP OFFICE NOTE  Mallory Boone [id number] [birth year]  HISTORY OF PRESENT ILLNESS: I had the pleasure of seeing Mallory Boone in follow-up in the neurology clinic on 08/17/2024.  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 She is again accompanied by her husband who helps supplement the history today.  Records and images were personally reviewed where available.  She reports doing well. She remains seizure-free since last visit, she has not had typical seizures since 2023. Her husband reported worsening word-finding difficulties in 2023 and MRI brain with and without contrast was done which did not show any acute changes, there is diffuse atrophy with chronic left temporal lobe encephalomalacia. Her husband denies any staring/unresponsive episodes. She is on Lacosamide  200mg  BID and Levetiracetam  ER 2000mg  at bedtime (500mg : 4 tabs at bedtime) without side effects. Over the past 1-2 month, he feels her aphasia has gotten worse. She denies any headaches, dizziness, vision changes, focal numbness/tingling/weakness. She had a fall a few months ago missing the curb. Left hand tremor is unchanged, she does not notice it and does not feel it interferes with her activities. She continues to stitch and states she uses a magnifying glass. Her husband reports she compensates well. He denies any worsening memory, although there were times she would relate a story of what she does at home and he shakes his head, reminding her they do crossword puzzles daily. She recalls she got ticked last night but could not remember why, her husband reminded her it was because she thought she washed the towels but he reminded her she had not. She manages her own medications without difficulties. Mood is overall good, no increased irritability.    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 She is amnestic of her seizures with no prior warning symptoms. Her husband describes stereotyped episodes where she would start rocking and grimacing, perseverating repeatedly saying I'm okay, it's okay for 10 seconds. She would be disoriented after, saying I'm hot, I'm cold, I need to use the bathroom, where is it? Her husband feels they occur more when she is slowing down or when it is quiet (such as in church). He has not seen any nocturnal seizures. She has only had 2 convulsions when she was initially diagnosed with encephalitis in 1996, none since then. He recalls her trying Dilantin  and Lamictal in the past. She has been on Keppra  XR 2000mg  daily for at least 10 years, Vimpat  100mg  BID was added on 18 months ago. She was having 2-3 seizures a day, and had a significant reduction with addition of Vimpat . Over the past 6 months, her husband has noticed she would sometimes do a little pedaling of both feet. Her husband reports an average of 2 seizures a week, last seizure was 2 days ago. No associated tongue bite or incontinence. No side effects on medications. She denies any olfactory/gustatory hallucinations, deja vu, rising epigastric sensation, focal numbness/tingling/weakness, myoclonic jerks. She denies any headaches, dizziness, diplopia, dysarthria/dysphagia, neck/back pain, bowel dysfunction. She has urinary frequency. Her hands feels stiff sometimes. She has had cognitive issues since the encephalitis, she does not drive. Her husband manages finances. She has to write things down in a journal daily. She manages her own medications. She is independent with dressing and bathing. They moved to [place] 3 months  ago to be closer to family. She said they moved a year ago. Her mother had Alzheimer's disease, her father had Lewy Body dementia.   Diagnostic Data: MRI brain with and without contrast 06/2018 no acute  changes, there was a large area of encephalomalacia at the anterior left temporal lobe, mild chronic microvascular disease greatest at the left frontal horn, ex vacuo dilatation of the left lateral ventricle.  EEG in 06/2018 showed sharp transients in sleep in the right frontal region with phase reversal at F8.    Epilepsy Risk Factors:  Encephalitis in 1996. Otherwise she had a normal birth and early development.  There is no history of febrile convulsions, significant traumatic brain injury, neurosurgical procedures, or family history of seizures.   Prior AEDs: Dilantin , lamictal   PAST MEDICAL HISTORY: Past Medical History:  Diagnosis Date   COVID 07/13/2022   Depression    Encephalitis    Osteoporosis    Seizures (HCC)     MEDICATIONS: Current Outpatient Medications on File Prior to Visit  Medication Sig Dispense Refill   alendronate  (FOSAMAX ) 70 MG tablet Take 70 mg by mouth once a week. Take with a full glass of water on an empty stomach.     calcium -vitamin D  (OSCAL WITH D) 500-200 MG-UNIT tablet Take 1 tablet by mouth.     lacosamide  (VIMPAT ) 200 MG TABS tablet Take 1 tablet (200 mg total) by mouth 2 (two) times daily. 180 tablet 3   levETIRAcetam  (KEPPRA  XR) 500 MG 24 hr tablet Take 4 tablets every night 360 tablet 3   LORazepam  (ATIVAN ) 1 MG tablet Take 1 tablet as needed for cluster of seizures. 10 tablet 5   Multiple Vitamins-Minerals (MULTIVITAMIN WITH MINERALS) tablet Take 1 tablet by mouth daily.     Vilazodone  HCl 20 MG TABS Take 20 mg by mouth daily.     No current facility-administered medications on file prior to visit.    ALLERGIES: Allergies  Allergen Reactions   Vancomycin  Other (See Comments)   Ampicillin  Rash and Other (See Comments)    Red maculopapular rash occurred in context of coadministration of ampicillin , vancomycin , and ceftriaxone     FAMILY HISTORY: Family History  Problem Relation Age of Onset   High blood pressure Mother    Breast cancer  Neg Hx     SOCIAL HISTORY: Social History   Socioeconomic History   Marital status: Married    Spouse name: Not on file   Number of children: Not on file   Years of education: College   Highest education level: Not on file  Occupational History   Not on file  Tobacco Use   Smoking status: Never   Smokeless tobacco: Never  Vaping Use   Vaping status: Never Used  Substance and Sexual Activity   Alcohol  use: Never   Drug use: Never   Sexual activity: Yes    Birth control/protection: Post-menopausal  Other Topics Concern   Not on file  Social History Narrative   Right handed      Lives with husband      Automotive Engineer edu   Social Drivers of Health   Financial Resource Strain: Not on file  Food Insecurity: Not on file  Transportation Needs: Not on file  Physical Activity: Not on file  Stress: Not on file  Social Connections: Not on file  Intimate Partner Violence: Not on file     PHYSICAL EXAM: Vitals:   08/17/24 1419  BP: (!) 144/83  Pulse: 77  SpO2: 97%  General: No acute distress Head:  Normocephalic/atraumatic Skin/Extremities: No rash, no edema Neurological Exam: alert and oriented to person, place, and time. She has mild expressive aphasia, saying a wrong word then correcting herself (initially gave wrong month then corrected to November, initially said a different word then corrected to pencil. Able to read and follow instructions. Cranial nerves: Pupils equal, round. Extraocular movements intact with no nystagmus. Visual fields full.  No facial asymmetry.  Motor: +cogwheeling on left. Muscle strength 5/5 throughout with no pronator drift.   Finger to nose testing intact.  Gait slow and cautious favoring left leg, no ataxia. She again has a resting tremor on the left hand, no postural or endpoint tremor.    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 She has not had typical seizures since 2023, they have not needed prn Lorazepam . Refills sent for Lacosamide  200mg  BID and Levetiracetam  ER 500mg  4 tabs at bedtime. Her husband reports worsening aphasia, he had noticed similar symptoms in 2023, MRI brain at the time no acute changes. No significant abnormalities on exam today, unchanged from prior visits. We agreed to hold off on repeating MRI unless there is any further worsening of symptoms. She does not drive. Follow-up in 1 year, call for any changes.   Thank you for allowing me to participate in her care.  Please do not hesitate to call for any questions or concerns.    Darice Shivers, M.D.   CC: Dr. Tisovec

## 2024-08-17 NOTE — Patient Instructions (Signed)
 It's always a pleasure to see you! Continue all your medications. Follow-up in 1 year, call for any changes.   Seizure Precautions: 1. If medication has been prescribed for you to prevent seizures, take it exactly as directed.  Do not stop taking the medicine without talking to your doctor first, even if you have not had a seizure in a long time.   2. Avoid activities in which a seizure would cause danger to yourself or to others.  Don't operate dangerous machinery, swim alone, or climb in high or dangerous places, such as on ladders, roofs, or girders.  Do not drive unless your doctor says you may.  3. If you have any warning that you may have a seizure, lay down in a safe place where you can't hurt yourself.    4.  No driving for 6 months from last seizure, as per [place]  state law.   Please refer to the following link on the [employer]'s website for more information: http://www.epilepsyfoundation.org/answerplace/Social/driving/drivingu.cfm   5.  Maintain good sleep hygiene. Avoid alcohol.  6.  Contact your doctor if you have any problems that may be related to the medicine you are taking.  7.  Call 911 and bring the patient back to the ED if:        A.  The seizure lasts longer than 5 minutes.       B.  The patient doesn't awaken shortly after the seizure  C.  The patient has new problems such as difficulty seeing, speaking or moving  D.  The patient was injured during the seizure  E.  The patient has a temperature over 102 F (39C)  F.  The patient vomited and now is having trouble breathing

## 2025-08-23 ENCOUNTER — Ambulatory Visit: Admitting: Neurology
